# Patient Record
Sex: Female | Born: 1950 | Race: Asian | Hispanic: No | Marital: Single | Smoking: Never smoker
Health system: Southern US, Community
[De-identification: ages and names within clinical notes are randomized; demographics above are authoritative.]

## PROBLEM LIST (undated history)

## (undated) DIAGNOSIS — M549 Dorsalgia, unspecified: Secondary | ICD-10-CM

## (undated) DIAGNOSIS — K760 Fatty (change of) liver, not elsewhere classified: Secondary | ICD-10-CM

## (undated) DIAGNOSIS — E039 Hypothyroidism, unspecified: Secondary | ICD-10-CM

## (undated) HISTORY — DX: Hypothyroidism, unspecified: E03.9

## (undated) HISTORY — PX: CATARACT EXTRACTION: SUR2

## (undated) HISTORY — DX: Fatty (change of) liver, not elsewhere classified: K76.0

---

## 2021-10-09 ENCOUNTER — Other Ambulatory Visit: Payer: Self-pay

## 2021-10-09 ENCOUNTER — Ambulatory Visit
Admission: RE | Admit: 2021-10-09 | Discharge: 2021-10-09 | Disposition: A | Discharge status: Home / Self Care | Payer: 59 | Source: Ambulatory Visit | Attending: Physician Assistant | Admitting: Physician Assistant

## 2021-10-09 ENCOUNTER — Ambulatory Visit (INDEPENDENT_AMBULATORY_CARE_PROVIDER_SITE_OTHER): Payer: 59

## 2021-10-09 VITALS — BP 120/76 | HR 80 | Temp 99.1°F | Resp 18

## 2021-10-09 DIAGNOSIS — J020 Streptococcal pharyngitis: Secondary | ICD-10-CM

## 2021-10-09 DIAGNOSIS — M549 Dorsalgia, unspecified: Secondary | ICD-10-CM | POA: Diagnosis not present

## 2021-10-09 DIAGNOSIS — J069 Acute upper respiratory infection, unspecified: Secondary | ICD-10-CM

## 2021-10-09 DIAGNOSIS — G8929 Other chronic pain: Secondary | ICD-10-CM

## 2021-10-09 DIAGNOSIS — M545 Low back pain, unspecified: Secondary | ICD-10-CM | POA: Diagnosis not present

## 2021-10-09 DIAGNOSIS — R059 Cough, unspecified: Secondary | ICD-10-CM | POA: Diagnosis not present

## 2021-10-09 HISTORY — DX: Dorsalgia, unspecified: M54.9

## 2021-10-09 LAB — POCT RAPID STREP A (OFFICE): Rapid Strep A Screen: POSITIVE — AB

## 2021-10-09 MED ORDER — NAPROXEN 500 MG PO TABS: 500.0000 mg | ORAL_TABLET | Freq: Two times a day (BID) | ORAL | 0 refills | Status: DC

## 2021-10-09 MED ORDER — AMOXICILLIN 500 MG PO CAPS: 500.0000 mg | ORAL_CAPSULE | Freq: Three times a day (TID) | ORAL | 0 refills | Status: DC

## 2021-10-09 NOTE — ED Provider Notes (Signed)
[facility]    CSN: [id number] Arrival date & time: 10/09/21  1255      History   Chief Complaint Chief Complaint  Patient presents with   Appointment    1300   Back Pain   Cough    HPI Cindy Rivera is a 71 y.o. female.   Patient here today for evaluation of cough she has had for the last week.  She has not had any fever.  She denies any other symptoms.  She also reports she has had some back pain for the last year.  She initially points to her low back for pain but then notes that with bowel movement she will have pain from her shoulders to her feet.  She does have appointment scheduled for primary care provider in the upcoming months.  They are concerned because she does have history of fracture.  She denies any known injury.  The history is provided by the patient.   Past Medical History:  Diagnosis Date   Back pain     There are no problems to display for this patient.   History reviewed. No pertinent surgical history.  OB History   No obstetric history on file.      Home Medications    Prior to Admission medications   Medication Sig Start Date End Date Taking? Authorizing Provider  amoxicillin (AMOXIL) 500 MG capsule Take 1 capsule (500 mg total) by mouth 3 (three) times daily. 10/09/21  Yes Tomi Bamberger, PA-C  naproxen (NAPROSYN) 500 MG tablet Take 1 tablet (500 mg total) by mouth 2 (two) times daily. 10/09/21  Yes Tomi Bamberger, PA-C    Family History History reviewed. No pertinent family history.  Social History Social History   Tobacco Use   Smoking status: Never   Smokeless tobacco: Never  Substance Use Topics   Alcohol use: Never   Drug use: Never     Allergies   Berberine   Review of Systems Review of Systems  Constitutional:  Negative for chills and fever.  HENT:  Positive for sore throat. Negative for congestion, ear pain and sinus pressure.   Eyes:  Negative for discharge and redness.  Respiratory:  Positive  for cough. Negative for shortness of breath and wheezing.   Gastrointestinal:  Negative for abdominal pain, diarrhea, nausea and vomiting.  Musculoskeletal:  Positive for back pain.    Physical Exam Triage Vital Signs ED Triage Vitals  Enc Vitals Group     BP      Pulse      Resp      Temp      Temp src      SpO2      Weight      Height      Head Circumference      Peak Flow      Pain Score      Pain Loc      Pain Edu?      Excl. in GC?    No data found.  Updated Vital Signs BP 120/76 (BP Location: Left Arm)    Pulse 80    Temp 99.1 F (37.3 C) (Oral)    Resp 18    SpO2 94%      Physical Exam Vitals and nursing note reviewed.  Constitutional:      General: She is not in acute distress.    Appearance: Normal appearance. She is not ill-appearing.  HENT:     Head: Normocephalic and atraumatic.  Nose: No congestion or rhinorrhea.     Mouth/Throat:     Mouth: Mucous membranes are moist.     Pharynx: No oropharyngeal exudate or posterior oropharyngeal erythema.  Eyes:     Conjunctiva/sclera: Conjunctivae normal.  Cardiovascular:     Rate and Rhythm: Normal rate and regular rhythm.     Heart sounds: Normal heart sounds. No murmur heard. Pulmonary:     Effort: Pulmonary effort is normal. No respiratory distress.     Breath sounds: Normal breath sounds. No wheezing, rhonchi or rales.  Musculoskeletal:     Comments: No TTP to midline spine or lower back  Skin:    General: Skin is warm and dry.  Neurological:     Mental Status: She is alert.  Psychiatric:        Mood and Affect: Mood normal.        Thought Content: Thought content normal.     UC Treatments / Results  Labs (all labs ordered are listed, but only abnormal results are displayed) Labs Reviewed  POCT RAPID STREP A (OFFICE) - Abnormal; Notable for the following components:      Result Value   Rapid Strep A Screen Positive (*)    All other components within normal limits  COVID-19, FLU A+B NAA     EKG   Radiology DG Chest 2 View  Result Date: 10/09/2021 CLINICAL DATA:  Cough EXAM: CHEST - 2 VIEW COMPARISON:  None. FINDINGS: The heart size and mediastinal contours are within normal limits. Both lungs are clear. The visualized skeletal structures are unremarkable. IMPRESSION: No acute abnormality of the lungs. Electronically Signed   By: Jearld Lesch M.D.   On: 10/09/2021 13:50   DG Lumbar Spine Complete  Result Date: 10/09/2021 CLINICAL DATA:  Back pain EXAM: LUMBAR SPINE - COMPLETE 4+ VIEW COMPARISON:  None. FINDINGS: Last lumbar vertebra is transitional. This report assumes partial lumbarization of S1 vertebra. No recent fracture is seen. Degenerative changes are noted in the lower lumbar spine with marked facet hypertrophy from L4-S1 levels. There is disc space narrowing at L4-L5 and L5-S1 levels. There is approximately 9 mm anterolisthesis at L4-L5 level. There is possible 5 mm anterolisthesis at L5-S1 level. IMPRESSION: No recent fracture is seen. Lumbar spondylosis, particularly severe at L4-L5 and L5-S1 levels as described in the body of the report. Electronically Signed   By: Ernie Avena M.D.   On: 10/09/2021 13:52    Procedures Procedures (including critical care time)  Medications Ordered in UC Medications - No data to display  Initial Impression / Assessment and Plan / UC Course  I have reviewed the triage vital signs and the nursing notes.  Pertinent labs & imaging results that were available during my care of the patient were reviewed by me and considered in my medical decision making (see chart for details).    CXR WNL, Strep test positive.  L Spine Xray with significant degenerative changes. Naproxen prescribed and Recommend follow up with PCP as planned. Follow up here with any concerns in the meantime.   Final Clinical Impressions(s) / UC Diagnoses   Final diagnoses:  Acute upper respiratory infection  Chronic back pain, unspecified back location,  unspecified back pain laterality  Streptococcal sore throat   Discharge Instructions   None    ED Prescriptions     Medication Sig Dispense Auth. Provider   naproxen (NAPROSYN) 500 MG tablet Take 1 tablet (500 mg total) by mouth 2 (two) times daily. 60 tablet Erma Pinto  F, PA-C   amoxicillin (AMOXIL) 500 MG capsule Take 1 capsule (500 mg total) by mouth 3 (three) times daily. 21 capsule Tomi Bamberger, PA-C      PDMP not reviewed this encounter.   Tomi Bamberger, PA-C 10/09/21 1511

## 2021-10-09 NOTE — ED Triage Notes (Signed)
Pt here for cough x 1 week; pt also sts back pain chronic x 1 year worse with having BM x months; pt does have PCP appointment in April and also has hx of similar with possible fracture; denies obvious injury

## 2021-10-11 LAB — COVID-19, FLU A+B NAA
Influenza A, NAA: NOT DETECTED
Influenza B, NAA: NOT DETECTED
SARS-CoV-2, NAA: NOT DETECTED

## 2021-11-01 ENCOUNTER — Other Ambulatory Visit: Payer: Self-pay

## 2021-11-01 ENCOUNTER — Ambulatory Visit (INDEPENDENT_AMBULATORY_CARE_PROVIDER_SITE_OTHER): Payer: 59

## 2021-11-01 ENCOUNTER — Ambulatory Visit
Admission: RE | Admit: 2021-11-01 | Discharge: 2021-11-01 | Disposition: A | Discharge status: Home / Self Care | Payer: 59 | Source: Ambulatory Visit | Attending: Physician Assistant | Admitting: Physician Assistant

## 2021-11-01 DIAGNOSIS — J029 Acute pharyngitis, unspecified: Secondary | ICD-10-CM

## 2021-11-01 DIAGNOSIS — R059 Cough, unspecified: Secondary | ICD-10-CM

## 2021-11-01 DIAGNOSIS — R0789 Other chest pain: Secondary | ICD-10-CM | POA: Diagnosis not present

## 2021-11-01 DIAGNOSIS — R0989 Other specified symptoms and signs involving the circulatory and respiratory systems: Secondary | ICD-10-CM

## 2021-11-01 LAB — POCT RAPID STREP A (OFFICE): Rapid Strep A Screen: NEGATIVE

## 2021-11-01 MED ORDER — OMEPRAZOLE 20 MG PO CPDR: 20.0000 mg | DELAYED_RELEASE_CAPSULE | Freq: Every day | ORAL | 0 refills | Status: DC

## 2021-11-01 NOTE — Discharge Instructions (Signed)
?  Strep test negative. Follow up with PCP as scheduled.  ?

## 2021-11-01 NOTE — ED Provider Notes (Addendum)
?[facility] ? ? ? ?CSN: [id number] ?Arrival date & time: 11/01/21  1705 ? ? ?  ? ?History   ?Chief Complaint ?Chief Complaint  ?Patient presents with  ? Cough  ? ? ?HPI ?Cindy Rivera is a 71 y.o. female.  ? ?Patient here today for evaluation of continued sore throat, cough and chest pressure since being seen close to a month ago. They are concerned she may have been exposed to strep again and is not sure that symptoms completely cleared. They request EKG. She has not had any fever. She has appointment with primary care next month.  ? ?The history is provided by the patient.  ?Cough ?Associated symptoms: sore throat   ?Associated symptoms: no chest pain, no chills, no eye discharge, no fever and no shortness of breath   ? ?Past Medical History:  ?Diagnosis Date  ? Back pain   ? ? ?There are no problems to display for this patient. ? ? ?History reviewed. No pertinent surgical history. ? ?OB History   ?No obstetric history on file. ?  ? ? ? ?Home Medications   ? ?Prior to Admission medications   ?Medication Sig Start Date End Date Taking? Authorizing Provider  ?amoxicillin (AMOXIL) 500 MG capsule Take 1 capsule (500 mg total) by mouth 3 (three) times daily. 10/09/21   Tomi Bamberger, PA-C  ?naproxen (NAPROSYN) 500 MG tablet Take 1 tablet (500 mg total) by mouth 2 (two) times daily. 10/09/21   Tomi Bamberger, PA-C  ? ? ?Family History ?History reviewed. No pertinent family history. ? ?Social History ?Social History  ? ?Tobacco Use  ? Smoking status: Never  ? Smokeless tobacco: Never  ?Substance Use Topics  ? Alcohol use: Never  ? Drug use: Never  ? ? ? ?Allergies   ?Berberine ? ? ?Review of Systems ?Review of Systems  ?Constitutional:  Negative for chills and fever.  ?HENT:  Positive for sore throat. Negative for congestion.   ?Eyes:  Negative for discharge and redness.  ?Respiratory:  Positive for cough. Negative for shortness of breath.   ?Cardiovascular:  Negative for chest pain.  ?Gastrointestinal:   Negative for nausea and vomiting.  ? ? ?Physical Exam ?Triage Vital Signs ?ED Triage Vitals [11/01/21 1723]  ?Enc Vitals Group  ?   BP 139/76  ?   Pulse Rate 66  ?   Resp 18  ?   Temp (!) 97.4 ?F (36.3 ?C)  ?   Temp Source Oral  ?   SpO2 93 %  ?   Weight   ?   Height   ?   Head Circumference   ?   Peak Flow   ?   Pain Score 0  ?   Pain Loc   ?   Pain Edu?   ?   Excl. in GC?   ? ?No data found. ? ?Updated Vital Signs ?BP 139/76 (BP Location: Left Arm)   Pulse 66   Temp (!) 97.4 ?F (36.3 ?C) (Oral)   Resp 18   SpO2 93%  ? ? ?Physical Exam ?Vitals and nursing note reviewed.  ?Constitutional:   ?   General: She is not in acute distress. ?   Appearance: Normal appearance. She is not ill-appearing.  ?HENT:  ?   Head: Normocephalic and atraumatic.  ?   Nose: No congestion or rhinorrhea.  ?   Mouth/Throat:  ?   Mouth: Mucous membranes are moist.  ?   Pharynx: Posterior oropharyngeal erythema present. No  oropharyngeal exudate.  ?Eyes:  ?   Conjunctiva/sclera: Conjunctivae normal.  ?Cardiovascular:  ?   Rate and Rhythm: Normal rate and regular rhythm.  ?   Heart sounds: Normal heart sounds. No murmur heard. ?Pulmonary:  ?   Effort: Pulmonary effort is normal. No respiratory distress.  ?   Breath sounds: Normal breath sounds. No wheezing, rhonchi or rales.  ?Skin: ?   General: Skin is warm and dry.  ?Neurological:  ?   Mental Status: She is alert.  ?Psychiatric:     ?   Mood and Affect: Mood normal.     ?   Thought Content: Thought content normal.  ? ? ? ?UC Treatments / Results  ?Labs ?(all labs ordered are listed, but only abnormal results are displayed) ?Labs Reviewed  ?COVID-19, FLU A+B NAA  ?POCT RAPID STREP A (OFFICE)  ? ? ?EKG ? ? ?Radiology ?DG Chest 2 View ? ?Result Date: 11/01/2021 ?CLINICAL DATA:  Cough, chest pressure EXAM: CHEST - 2 VIEW COMPARISON:  10/09/2021 FINDINGS: Heart and mediastinal contours are within normal limits. No focal opacities or effusions. No acute bony abnormality. IMPRESSION: No active  cardiopulmonary disease. Electronically Signed   By: Charlett Nose M.D.   On: 11/01/2021 17:58   ? ?Procedures ?Procedures (including critical care time) ? ?Medications Ordered in UC ?Medications - No data to display ? ?Initial Impression / Assessment and Plan / UC Course  ?I have reviewed the triage vital signs and the nursing notes. ? ?Pertinent labs & imaging results that were available during my care of the patient were reviewed by me and considered in my medical decision making (see chart for details). ? ?  ?Strep test negative. CXR and EKG without concerning findings. Recommended follow up with PCP as scheduled. Will order covid and flu screening. Encouraged follow up sooner with any concerns.  ? ?After leaving the room they request prescription for possible acid reflux. Omeprazole rx sent to pharmacy.  ? ?Final Clinical Impressions(s) / UC Diagnoses  ? ?Final diagnoses:  ?Sore throat  ?Chest pressure  ? ?Discharge Instructions   ?None ?  ? ?ED Prescriptions   ?None ?  ? ?PDMP not reviewed this encounter. ?  ?Tomi Bamberger, PA-C ?11/01/21 1839 ? ?  ?Tomi Bamberger, PA-C ?11/01/21 1853 ? ?

## 2021-11-01 NOTE — ED Triage Notes (Signed)
Pt c/o cough and chest pressure. States tested strep(+) a few weeks ago. Was tx here. Family states pt was caring for kids recently that also tested strep(+). Family states pt sxs got better during her tx but not completely resolved and are concerned about the chest pressure she is still experiencing.  ?

## 2021-11-03 LAB — COVID-19, FLU A+B NAA
Influenza A, NAA: NOT DETECTED
Influenza B, NAA: NOT DETECTED
SARS-CoV-2, NAA: NOT DETECTED

## 2021-12-03 ENCOUNTER — Encounter: Payer: Self-pay | Admitting: Internal Medicine

## 2021-12-03 ENCOUNTER — Ambulatory Visit: Payer: 59 | Attending: Internal Medicine | Admitting: Internal Medicine

## 2021-12-03 VITALS — BP 116/74 | HR 71 | Resp 16 | Ht 61.5 in | Wt 145.6 lb

## 2021-12-03 DIAGNOSIS — J312 Chronic pharyngitis: Secondary | ICD-10-CM

## 2021-12-03 DIAGNOSIS — Z7689 Persons encountering health services in other specified circumstances: Secondary | ICD-10-CM | POA: Diagnosis not present

## 2021-12-03 DIAGNOSIS — R053 Chronic cough: Secondary | ICD-10-CM | POA: Diagnosis not present

## 2021-12-03 DIAGNOSIS — M5416 Radiculopathy, lumbar region: Secondary | ICD-10-CM | POA: Diagnosis not present

## 2021-12-03 DIAGNOSIS — I2510 Atherosclerotic heart disease of native coronary artery without angina pectoris: Secondary | ICD-10-CM

## 2021-12-03 DIAGNOSIS — R7989 Other specified abnormal findings of blood chemistry: Secondary | ICD-10-CM

## 2021-12-03 DIAGNOSIS — K219 Gastro-esophageal reflux disease without esophagitis: Secondary | ICD-10-CM

## 2021-12-03 MED ORDER — LIDOCAINE 5 % EX PTCH: 1.0000 | MEDICATED_PATCH | CUTANEOUS | 1 refills | Status: DC

## 2021-12-03 MED ORDER — OMEPRAZOLE 20 MG PO CPDR: 20.0000 mg | DELAYED_RELEASE_CAPSULE | Freq: Two times a day (BID) | ORAL | 4 refills | Status: DC

## 2021-12-03 MED ORDER — FLUTICASONE PROPIONATE 50 MCG/ACT NA SUSP: NASAL | 1 refills | Status: DC

## 2021-12-03 MED ORDER — LORATADINE 10 MG PO TABS: 10.0000 mg | ORAL_TABLET | Freq: Every day | ORAL | 1 refills | Status: DC

## 2021-12-03 NOTE — Progress Notes (Signed)
? ? ?Patient ID: Cindy LatinaXiulan Duby, female    DOB: [birth year]  MRN: [id number] ? ?CC: Hospitalization Follow-up (Urgent care f/u/Cough/Sore throat /Chest pain/Pain scale 3 out of 10 ), Back Pain (Pain scale 7/8 out of 10), and New Patient (Initial Visit) ? ? ?Subjective: ?Cindy Rivera is a 71 y.o. female who presents to est care.  Daughter, Chartered certified accountantCarina Daughtry, is with her and interprets.  Pt speaks [place] ?Her concerns today include:  ? ?No previous PCP in the area. 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  ? ?Patient has several medications with her from [place] that includes atorvastatin, aspirin, and allergy pill, and antibiotic ? ?Complains of chronic bilateral lower back pain x20 years.  [place] with disc problem [place] country.  Pain was mild until 1 year ago.  She has been using a back support intermittently that her daughter purchased for her off the Internet. ?-Pain is across the lower back and in tailbone.  Radiates down both legs and associated with numbness and tingling.  No numbness in the groin area.  No incontinence of bowel or bladder.  Symptoms are most noticeable when she sits on the commode to have a bowel movement, with lifting and increased physical activity like when she is cleaning up the house.  Rates pain as 8-9/10 with these activities.  Otherwise it stays at a level of 6-7/10.  Given Naprosyn to use as needed through UC recently.  Taking it once a day because it makes her acid reflux worse.  She is on omeprazole. ?-X-ray of lumbar spine done through urgent care 10/09/2021 revealed lumbar spondylosis particularly severe at L4-S1 with some anterolisthesis also at these levels. ? ?Complains of chronic cough and sore throat x20 years. ?-Diagnosed with strep throat through urgent care 10/09/2021 when she presented with cough and sore throat.  Daughter reports that her youngest son got strep throat and RSV the  same week.   ?-Chest x-ray without acute findings.  Treated with amoxicillin 3 times daily for 7 days which she completed. ?-Return to urgent care 11/01/2021 with complaint of continued sore throat, cough and chest pressure.  Repeat strep test was negative.  Chest x-ray and EKG without concerning findings.  They recommended follow-up with PCP. ?-Patient tells me that her cough got worse after having strep throat.  Cough is productive of white mucus and worse at night and in the mornings.  Endorses GERD symptoms at nights but states it is a little better since being on omeprazole.  Endorses itchy throat and sneezing.  Not sure of symptoms of postnasal drip.  She is not taking her allergy pill. ?-Denies dysphagia or voice changes.  No major weight changes. ? ?Gives hx of CAD. Had imaging of heart in [place] (not sure if cath vs coronary CT) and told she had mild blockage in 2021.  Placed on ASA and Lipitor but has not been taking consistently. Reports intermittent CP with exertion.  She uses a pill which she was given by her doctor in [place] that sounds like SL Nitro  ? 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  TSH was found to be 8.29.  No known history of hypothyroidism. ? ?Current Outpatient Medications on File Prior to Visit  ?Medication Sig Dispense Refill  ? ASPIRIN 81 PO Take by mouth.    ? atorvastatin (  LIPITOR) 20 MG tablet Take 20 mg by mouth daily.    ? ?No current facility-administered medications on file prior to visit.  ? ? ?Allergies  ?Allergen Reactions  ? Berberine   ? ? ?Social History  ? ?Socioeconomic History  ? Marital status: Single  ?  Spouse name: Not on file  ? Number of children: Not on file  ? Years of education: Not on file  ? Highest education level: Not on file  ?Occupational History  ? Not on file  ?Tobacco Use  ? Smoking status: Never  ? Smokeless tobacco: Never  ?Substance and Sexual Activity  ? Alcohol use: Never  ?  Drug use: Never  ? Sexual activity: Not on file  ?Other Topics Concern  ? Not on file  ?Social History Narrative  ? Not on file  ? ?Social Determinants of Health  ? ?Financial Resource Strain: Not on file  ?Food Insecurity: Not on file  ?Transportation Needs: Not on file  ?Physical Activity: Not on file  ?Stress: Not on file  ?Social Connections: Not on file  ?Intimate Partner Violence: Not on file  ? ? ?Family History  ?Family history unknown: Yes  ? ? ?Past Surgical History:  ?Procedure Laterality Date  ? CATARACT EXTRACTION    ? ? ?ROS: ?Review of Systems ?Negative except as stated above ? ?PHYSICAL EXAM: ?BP 116/74   Pulse 71   Resp 16   Ht 5' 1.5" (1.562 m)   Wt 145 lb 9.6 oz (66 kg)   SpO2 95%   BMI 27.07 kg/m?   ?Wt Readings from Last 3 Encounters:  ?12/03/21 145 lb 9.6 oz (66 kg)  ? ? ?Physical Exam ? ?General appearance - alert, well appearing, and in no distress.  Patient is a very difficult historian ?Mental status - normal mood, behavior, speech, dress, motor activity, and thought processes ?Eyes - pupils equal and reactive, extraocular eye movements intact ?Nose - normal and patent, no erythema, discharge or polyps ?Mouth - mucous membranes moist, pharynx normal without lesions ?Neck - supple, no significant adenopathy ?Lymphatics -no cervical axillary lymphadenopathy. ?Chest - clear to auscultation, no wheezes, rales or rhonchi, symmetric air entry ?Heart - normal rate, regular rhythm, normal S1, S2, no murmurs, rubs, clicks or gallops ?Neurological -straight leg raise negative.  Power in both lower extremities 5/5 bilaterally.  On gross sensation, she reports feeling that the left lower leg is cold compared to the right.  Both legs feel warm on exam.  No cyanosis noted.  No lower extremity edema. ?Patient noted to get up off exam table and stand up without apparent difficulty.  Not ambulating with any assistive device. ? ? ?  Latest Ref Rng & Units 12/03/2021  ? 11:05 AM  ?CMP  ?Glucose 70 - 99  mg/dL 98    ?BUN 8 - 27 mg/dL 13    ?Creatinine 0.57 - 1.00 mg/dL 5.09    ?Sodium 134 - 144 mmol/L 144    ?Potassium 3.5 - 5.2 mmol/L 5.0    ?Chloride 96 - 106 mmol/L 104    ?CO2 20 - 29 mmol/L 27    ?Calcium 8.7 - 10.3 mg/dL 9.6    ?Total Protein 6.0 - 8.5 g/dL 7.7    ?Total Bilirubin 0.0 - 1.2 mg/dL 0.7    ?Alkaline Phos 44 - 121 IU/L 65    ?AST 0 - 40 IU/L 19    ?ALT 0 - 32 IU/L 15    ? ?Lipid Panel  ?   ?  Component Value Date/Time  ? CHOL 308 (H) 12/03/2021 1105  ? TRIG 87 12/03/2021 1105  ? HDL 60 12/03/2021 1105  ? CHOLHDL 5.1 (H) 12/03/2021 1105  ? LDLCALC 234 (H) 12/03/2021 1105  ? ? ?CBC ?   ?Component Value Date/Time  ? WBC 4.9 12/03/2021 1105  ? RBC 4.52 12/03/2021 1105  ? HGB 13.5 12/03/2021 1105  ? HCT 40.8 12/03/2021 1105  ? PLT 226 12/03/2021 1105  ? MCV 90 12/03/2021 1105  ? MCH 29.9 12/03/2021 1105  ? MCHC 33.1 12/03/2021 1105  ? RDW 12.4 12/03/2021 1105  ? ? ?ASSESSMENT AND PLAN: ?1. Establishing care with new doctor, encounter for ? ? ?2. Bilateral lumbar radiculopathy ?Symptoms suggestive of sciatica either due to disc ds vs spinal stenosis. ?Patient reports symptoms have worsened over the past 1 year with more persistent pain.  I will order an MRI of the back.  Advised to avoid heavy lifting or strenuous activities that aggravates her back. ?Advised to stop Naprosyn given that this makes her GERD symptoms worse.  We will try her with a lidocaine patch. ?- lidocaine (LIDODERM) 5 %; Place 1 patch onto the skin daily. Remove & Discard patch within 12 hours or as directed by MD  Dispense: 30 patch; Refill: 1 ?- MR Lumbar Spine Wo Contrast; Future ? ?3. Chronic cough ?Differential diagnoses include chronic cough/sore throat due to GERD and allergies/postnasal drip. ?Recommend starting Claritin and Flonase nasal spray.  Advised that the nasal spray can cause nosebleeds if used on a daily basis. ?GERD precautions discussed.  Advised to avoid certain foods like spicy foods, tomato-based foods, juices and  excessive caffeine.  Advised to eat his last meal at least 2 to 3 hours before laying down at nights and to sleep with his head slightly elevated.  Omeprazole increased to twice daily dosing. ? ?- loratadine

## 2021-12-03 NOTE — Patient Instructions (Signed)
Gastroesophageal Reflux Disease, Adult  Gastroesophageal reflux (GER) happens when acid from the stomach flows up into the tube that connects the mouth and the stomach (esophagus). Normally, food travels down the esophagus and stays in the stomach to be digested. With GER, food and stomach acid sometimes move back up into the esophagus. You may have a disease called gastroesophageal reflux disease (GERD) if the reflux: Happens often. Causes frequent or very bad symptoms. Causes problems such as damage to the esophagus. When this happens, the esophagus becomes sore and swollen. Over time, GERD can make small holes (ulcers) in the lining of the esophagus. What are the causes? This condition is caused by a problem with the muscle between the esophagus and the stomach. When this muscle is weak or not normal, it does not close properly to keep food and acid from coming back up from the stomach. The muscle can be weak because of: Tobacco use. Pregnancy. Having a certain type of hernia (hiatal hernia). Alcohol use. Certain foods and drinks, such as coffee, chocolate, onions, and peppermint. What increases the risk? Being overweight. Having a disease that affects your connective tissue. Taking NSAIDs, such a ibuprofen. What are the signs or symptoms? Heartburn. Difficult or painful swallowing. The feeling of having a lump in the throat. A bitter taste in the mouth. Bad breath. Having a lot of saliva. Having an upset or bloated stomach. Burping. Chest pain. Different conditions can cause chest pain. Make sure you see your doctor if you have chest pain. Shortness of breath or wheezing. A long-term cough or a cough at night. Wearing away of the surface of teeth (tooth enamel). Weight loss. How is this treated? Making changes to your diet. Taking medicine. Having surgery. Treatment will depend on how bad your symptoms are. Follow these instructions at home: Eating and drinking  Follow a  diet as told by your doctor. You may need to avoid foods and drinks such as: Coffee and tea, with or without caffeine. Drinks that contain alcohol. Energy drinks and sports drinks. Bubbly (carbonated) drinks or sodas. Chocolate and cocoa. Peppermint and mint flavorings. Garlic and onions. Horseradish. Spicy and acidic foods. These include peppers, chili powder, curry powder, vinegar, hot sauces, and BBQ sauce. Citrus fruit juices and citrus fruits, such as oranges, lemons, and limes. Tomato-based foods. These include red sauce, chili, salsa, and pizza with red sauce. Fried and fatty foods. These include donuts, french fries, potato chips, and high-fat dressings. High-fat meats. These include hot dogs, rib eye steak, sausage, ham, and bacon. High-fat dairy items, such as whole milk, butter, and cream cheese. Eat small meals often. Avoid eating large meals. Avoid drinking large amounts of liquid with your meals. Avoid eating meals during the 2-3 hours before bedtime. Avoid lying down right after you eat. Do not exercise right after you eat. Lifestyle  Do not smoke or use any products that contain nicotine or tobacco. If you need help quitting, ask your doctor. Try to lower your stress. If you need help doing this, ask your doctor. If you are overweight, lose an amount of weight that is healthy for you. Ask your doctor about a safe weight loss goal. General instructions Pay attention to any changes in your symptoms. Take over-the-counter and prescription medicines only as told by your doctor. Do not take aspirin, ibuprofen, or other NSAIDs unless your doctor says it is okay. Wear loose clothes. Do not wear anything tight around your waist. Raise (elevate) the head of your bed about   6 inches (15 cm). You may need to use a wedge to do this. Avoid bending over if this makes your symptoms worse. Keep all follow-up visits. Contact a doctor if: You have new symptoms. You lose weight and you  do not know why. You have trouble swallowing or it hurts to swallow. You have wheezing or a cough that keeps happening. You have a hoarse voice. Your symptoms do not get better with treatment. Get help right away if: You have sudden pain in your arms, neck, jaw, teeth, or back. You suddenly feel sweaty, dizzy, or light-headed. You have chest pain or shortness of breath. You vomit and the vomit is green, yellow, or black, or it looks like blood or coffee grounds. You faint. Your poop (stool) is red, bloody, or black. You cannot swallow, drink, or eat. These symptoms may represent a serious problem that is an emergency. Do not wait to see if the symptoms will go away. Get medical help right away. Call your local emergency services (911 in the [place] Do not drive yourself to the hospital. Summary If a person has gastroesophageal reflux disease (GERD), food and stomach acid move back up into the esophagus and cause symptoms or problems such as damage to the esophagus. Treatment will depend on how bad your symptoms are. Follow a diet as told by your doctor. Take all medicines only as told by your doctor. This information is not intended to replace advice given to you by your health care provider. Make sure you discuss any questions you have with your health care provider. Document Revised: 02/17/2020 Document Reviewed: 02/17/2020 Elsevier Patient Education  2023 Elsevier Inc.  

## 2021-12-04 ENCOUNTER — Other Ambulatory Visit: Payer: Self-pay | Admitting: Internal Medicine

## 2021-12-04 DIAGNOSIS — J312 Chronic pharyngitis: Secondary | ICD-10-CM | POA: Insufficient documentation

## 2021-12-04 DIAGNOSIS — R7989 Other specified abnormal findings of blood chemistry: Secondary | ICD-10-CM | POA: Insufficient documentation

## 2021-12-04 DIAGNOSIS — K219 Gastro-esophageal reflux disease without esophagitis: Secondary | ICD-10-CM | POA: Insufficient documentation

## 2021-12-04 DIAGNOSIS — R053 Chronic cough: Secondary | ICD-10-CM | POA: Insufficient documentation

## 2021-12-04 DIAGNOSIS — M5416 Radiculopathy, lumbar region: Secondary | ICD-10-CM | POA: Insufficient documentation

## 2021-12-04 DIAGNOSIS — I2510 Atherosclerotic heart disease of native coronary artery without angina pectoris: Secondary | ICD-10-CM | POA: Insufficient documentation

## 2021-12-04 LAB — COMPREHENSIVE METABOLIC PANEL
ALT: 15 IU/L (ref 0–32)
AST: 19 IU/L (ref 0–40)
Albumin/Globulin Ratio: 1.7 (ref 1.2–2.2)
Albumin: 4.8 g/dL (ref 3.8–4.8)
Alkaline Phosphatase: 65 IU/L (ref 44–121)
BUN/Creatinine Ratio: 15 (ref 12–28)
BUN: 13 mg/dL (ref 8–27)
Bilirubin Total: 0.7 mg/dL (ref 0.0–1.2)
CO2: 27 mmol/L (ref 20–29)
Calcium: 9.6 mg/dL (ref 8.7–10.3)
Chloride: 104 mmol/L (ref 96–106)
Creatinine, Ser: 0.87 mg/dL (ref 0.57–1.00)
Globulin, Total: 2.9 g/dL (ref 1.5–4.5)
Glucose: 98 mg/dL (ref 70–99)
Potassium: 5 mmol/L (ref 3.5–5.2)
Sodium: 144 mmol/L (ref 134–144)
Total Protein: 7.7 g/dL (ref 6.0–8.5)
eGFR: 72 mL/min/{1.73_m2} (ref >59)

## 2021-12-04 LAB — CBC
Hematocrit: 40.8 % (ref 34.0–46.6)
Hemoglobin: 13.5 g/dL (ref 11.1–15.9)
MCH: 29.9 pg (ref 26.6–33.0)
MCHC: 33.1 g/dL (ref 31.5–35.7)
MCV: 90 fL (ref 79–97)
Platelets: 226 10*3/uL (ref 150–450)
RBC: 4.52 x10E6/uL (ref 3.77–5.28)
RDW: 12.4 % (ref 11.7–15.4)
WBC: 4.9 10*3/uL (ref 3.4–10.8)

## 2021-12-04 LAB — LIPID PANEL
Chol/HDL Ratio: 5.1 ratio — ABNORMAL HIGH (ref 0.0–4.4)
Cholesterol, Total: 308 mg/dL — ABNORMAL HIGH (ref 100–199)
HDL: 60 mg/dL (ref >39)
LDL Chol Calc (NIH): 234 mg/dL — ABNORMAL HIGH (ref 0–99)
Triglycerides: 87 mg/dL (ref 0–149)
VLDL Cholesterol Cal: 14 mg/dL (ref 5–40)

## 2021-12-04 LAB — TSH+T4F+T3FREE
Free T4: 0.71 ng/dL — ABNORMAL LOW (ref 0.82–1.77)
T3, Free: 2.4 pg/mL (ref 2.0–4.4)
TSH: 6.4 u[IU]/mL — ABNORMAL HIGH (ref 0.450–4.500)

## 2021-12-04 MED ORDER — LEVOTHYROXINE SODIUM 25 MCG PO TABS: 25.0000 ug | ORAL_TABLET | Freq: Every day | ORAL | 3 refills | Status: DC

## 2021-12-05 ENCOUNTER — Other Ambulatory Visit: Payer: Self-pay | Admitting: Internal Medicine

## 2021-12-05 DIAGNOSIS — K219 Gastro-esophageal reflux disease without esophagitis: Secondary | ICD-10-CM

## 2021-12-06 NOTE — Telephone Encounter (Signed)
Requested medication (s) are due for refill today: see note for dose adjustment per pharmacy ? ?Requested medication (s) are on the active medication list: yes ? ?Last refill:  12/03/21 #60 4 refills ? ?Future visit scheduled: yes in 1 month ? ?Notes to clinic:  Pharmacy comment: Insurance  only pays for 1 cap per day. Not 2 per day.  Can we get  Rx for 40mg ?  thanks ? ? ?  ?Requested Prescriptions  ?Pending Prescriptions Disp Refills  ? omeprazole (PRILOSEC) 20 MG capsule [Pharmacy Med Name: OMEPRAZOLE 20MG  CAP] 60 capsule 4  ?  Sig: TAKE 1 CAPSULE BY MOUTH TWICE DAILY BEFORE A MEAL  ?  ? Gastroenterology: Proton Pump Inhibitors Passed - 12/05/2021  4:41 PM  ?  ?  Passed - Valid encounter within last 12 months  ?  Recent Outpatient Visits   ? ?      ? 3 days ago Establishing care with new doctor, encounter for  ? [facility] 12/07/2021, MD  ? ?  ?  ?Future Appointments   ? ?        ? In 1 month [facility], MD [facility]  ? ?  ? ? ?  ?  ?  ? ?

## 2021-12-07 MED ORDER — OMEPRAZOLE 40 MG PO CPDR: 40.0000 mg | DELAYED_RELEASE_CAPSULE | Freq: Every day | ORAL | 1 refills | Status: DC

## 2021-12-08 ENCOUNTER — Encounter: Payer: Self-pay | Admitting: Internal Medicine

## 2021-12-10 ENCOUNTER — Other Ambulatory Visit: Payer: Self-pay | Admitting: Family Medicine

## 2021-12-10 MED ORDER — ATORVASTATIN CALCIUM 20 MG PO TABS: 20.0000 mg | ORAL_TABLET | Freq: Every day | ORAL | 1 refills | Status: DC

## 2021-12-20 ENCOUNTER — Ambulatory Visit: Payer: 59 | Admitting: Internal Medicine

## 2021-12-20 ENCOUNTER — Encounter: Payer: Self-pay | Admitting: Internal Medicine

## 2021-12-20 VITALS — BP 112/64 | HR 61 | Ht <= 58 in | Wt 147.8 lb

## 2021-12-20 DIAGNOSIS — R079 Chest pain, unspecified: Secondary | ICD-10-CM | POA: Diagnosis not present

## 2021-12-20 DIAGNOSIS — R072 Precordial pain: Secondary | ICD-10-CM

## 2021-12-20 MED ORDER — METOPROLOL TARTRATE 50 MG PO TABS: 50.0000 mg | ORAL_TABLET | Freq: Once | ORAL | 0 refills | Status: DC

## 2021-12-20 NOTE — Progress Notes (Signed)
?Cardiology Office Note:   ? ?Date:  12/20/2021  ? ?ID:  [name], DOB May 30, 1951, MRN [id number] ? ?PCP:  Pcp, No ?  ?[facility] Providers ?Cardiologist:  Maisie Fus, MD    ? ?Referring MD: Marcine Matar, MD  ? ?No chief complaint on file. ?Chest pain ? ?History of Present Illness:   ? ?Cindy Rivera is a 71 y.o. female with a hx of back pain, latent TB,  hypothyroidism , referral for ?atherosclerosis ? ?[facility] referred to cardiology for atherosclerosis . She had a work up in [place] and told he had atherosclerosis.  She noted having an [place] diagnosed with atherosclerosis. He was started on aspirin and lipitor. He reported intermittent chest discomfort with exertion. When she walks she feels chest pressure and SOB.  Non smoker.  No orthopnea or PND. No LE edema. No DM2. Normal blood pressure ? ?Past Medical History:  ?Diagnosis Date  ? Back pain   ? ? ?Past Surgical History:  ?Procedure Laterality Date  ? CATARACT EXTRACTION    ? ? ?Current Medications: ?Current Meds  ?Medication Sig  ? atorvastatin (LIPITOR) 20 MG tablet Take 1 tablet (20 mg total) by mouth daily.  ? levothyroxine (SYNTHROID) 25 MCG tablet Take 1 tablet (25 mcg total) by mouth daily.  ? lidocaine (LIDODERM) 5 % Place 1 patch onto the skin daily. Remove & Discard patch within 12 hours or as directed by MD  ? loratadine (CLARITIN) 10 MG tablet Take 1 tablet (10 mg total) by mouth daily.  ? metoprolol tartrate (LOPRESSOR) 50 MG tablet Take 1 tablet (50 mg total) by mouth once for 1 dose. PLEASE TAKE METOPROLOL 2  HOURS PRIOR TO CTA SCAN.  ? omeprazole (PRILOSEC) 40 MG capsule Take 1 capsule (40 mg total) by mouth daily.  ?  ? ?Allergies:   Berberine  ? ?Social History  ? ?Socioeconomic History  ? Marital status: Single  ?  Spouse name: Not on file  ? Number of children: Not on file  ? Years of education: Not on file  ? Highest education level: Not on file  ?Occupational History  ? Not on  file  ?Tobacco Use  ? Smoking status: Never  ? Smokeless tobacco: Never  ?Substance and Sexual Activity  ? Alcohol use: Never  ? Drug use: Never  ? Sexual activity: Not on file  ?Other Topics Concern  ? Not on file  ?Social History Narrative  ? Not on file  ? ?Social Determinants of Health  ? ?Financial Resource Strain: Not on file  ?Food Insecurity: Not on file  ?Transportation Needs: Not on file  ?Physical Activity: Not on file  ?Stress: Not on file  ?Social Connections: Not on file  ?  ? ?Family History: ?The patient's unknown, no consistent follow up in [place] ? ?ROS:   ?Please see the history of present illness.    ? ? All other systems reviewed and are negative. ? ?EKGs/Labs/Other Studies Reviewed:   ? ?The following studies were reviewed today: ? ? ?EKG:  EKG is  ordered today.  The ekg ordered today demonstrates  ? ?12/20/2021-NSR ? ?Recent Labs: ?12/03/2021: ALT 15; BUN 13; Creatinine, Ser 0.87; Hemoglobin 13.5; Platelets 226; Potassium 5.0; Sodium 144; TSH 6.400  ?Recent Lipid Panel ?   ?Component Value Date/Time  ? CHOL 308 (H) 12/03/2021 1105  ? TRIG 87 12/03/2021 1105  ? HDL 60 12/03/2021 1105  ? CHOLHDL 5.1 (H)  12/03/2021 1105  ? LDLCALC 234 (H) 12/03/2021 1105  ? ? ? ?Risk Assessment/Calculations:   ?  ? ?    ? ?Physical Exam:   ? ?VS:   ? ?Vitals:  ? 12/20/21 1453  ?BP: 112/64  ?Pulse: 61  ?SpO2: 98%  ? ? ? ?Wt Readings from Last 3 Encounters:  ?12/20/21 147 lb 12.8 oz (67 kg)  ?12/03/21 145 lb 9.6 oz (66 kg)  ?  ? ?GEN:  Well nourished, well developed in no acute distress ?HEENT: Normal ?NECK: No JVD; No carotid bruits ?LYMPHATICS: No lymphadenopathy ?CARDIAC: RRR, no murmurs, rubs, gallops ?RESPIRATORY:  Clear to auscultation without rales, wheezing or rhonchi  ?ABDOMEN: Soft, non-tender, non-distended ?MUSCULOSKELETAL:  No edema; No deformity  ?SKIN: Warm and dry ?NEUROLOGIC:  Alert and oriented x 3 ?PSYCHIATRIC:  Normal affect  ? ?ASSESSMENT:   ? ?Chest pain: notes chest discomfort with  exertion c/f angina. LDL is very high. Noted to have possible atherosclerosis in [place]. Will plan for coronary CTA and an echo. ? ?HLD: profound. LDL 243 mg/dL. Just started atorvastatin. Will repeat in lipids in 6 weeks and likely need to uptritrate. Other options include repatha ? ? ?PLAN:   ? ?In order of problems listed above: ? ?Coronary CTA ?Echo ?Fasting lipids in 6 weeks ?Follow up in 3 months ? ?   ? ?   ?Medication Adjustments/Labs and Tests Ordered: ?Current medicines are reviewed at length with the patient today.  Concerns regarding medicines are outlined above.  ?Orders Placed This Encounter  ?Procedures  ? CT CORONARY MORPH W/CTA COR W/SCORE W/CA W/CM &/OR WO/CM  ? Lipid panel  ? EKG 12-Lead  ? ECHOCARDIOGRAM COMPLETE  ? ?Meds ordered this encounter  ?Medications  ? metoprolol tartrate (LOPRESSOR) 50 MG tablet  ?  Sig: Take 1 tablet (50 mg total) by mouth once for 1 dose. PLEASE TAKE METOPROLOL 2  HOURS PRIOR TO CTA SCAN.  ?  Dispense:  1 tablet  ?  Refill:  0  ? ? ?Patient Instructions  ?Medication Instructions:  ?PLEASE TAKE METOPROLOL 50mg  TWO HOURS PRIOR TO CTA SCAN  ?*If you need a refill on your cardiac medications before your next appointment, please call your pharmacy* ? ?Lab Work: ? ?Please return for Blood Work in 6 WEEKS. No appointment needed, lab here at the office is open Monday-Friday from 8AM to 4PM and closed daily for lunch from 12:45-1:45.  ? ?If you have labs (blood work) drawn today and your tests are completely normal, you will receive your results only by: ?MyChart Message (if you have MyChart) OR ?A paper copy in the mail ?If you have any lab test that is abnormal or we need to change your treatment, we will call you to review the results. ? ?Testing/Procedures: ?Your physician has requested that you have cardiac CT. Cardiac computed tomography (CT) is a painless test that uses an x-ray machine to take clear, detailed pictures of your heart. For further information please visit  [email or web address]. Please follow instruction sheet as given. ? ?Your physician has requested that you have an echocardiogram. Echocardiography is a painless test that uses sound waves to create images of your heart. It provides your doctor with information about the size and shape of your heart and how well your heart?s chambers and valves are working. You may receive an ultrasound enhancing agent through an IV if needed to better visualize your heart during the echo.This procedure takes approximately one hour. There are no restrictions for this  procedure. This will take place at the [address].  ? ?Follow-Up: ?At [facility], you and your health needs are our priority.  As part of our continuing mission to provide you with exceptional heart care, we have created designated Provider Care Teams.  These Care Teams include your primary Cardiologist (physician) and Advanced Practice Providers (APPs -  Physician Assistants and Nurse Practitioners) who all work together to provide you with the care you need, when you need it. ? ?Your next appointment:   ?3 month(s) ? ?The format for your next appointment:   ?In Person ? ?Provider:   ?Maisie Fus, MD   ? ?Other Instructions ? ? ?Your cardiac CT will be scheduled at one of the below locations:  ? ?[facility] ?[address] ?[place], [place] [address] ?[phone] ? ?If scheduled at [facility], please arrive at the [facility] and Children's Entrance (Entrance C2) of [facility] 30 minutes prior to test start time. ?You can use the FREE valet parking offered at entrance C (encouraged to control the heart rate for the test)  ?Proceed to the [facility] Radiology Department (first floor) to check-in and test prep. ? ?All radiology patients and guests should use entrance C2 at [facility], accessed from [facility], even though the hospital's physical address listed is [address] ? ? ? ?Please follow these instructions carefully (unless otherwise directed): ? ?On the Night Before the Test: ?Be sure to Drink plenty of water. ?Do not consume any caffeinated/decaffeinated beverages or chocolate 12 h

## 2021-12-20 NOTE — Patient Instructions (Signed)
Medication Instructions:  ?PLEASE TAKE METOPROLOL 50mg  TWO HOURS PRIOR TO CTA SCAN  ?*If you need a refill on your cardiac medications before your next appointment, please call your pharmacy* ? ?Lab Work: ? ?Please return for Blood Work in 6 WEEKS. No appointment needed, lab here at the office is open Monday-Friday from 8AM to 4PM and closed daily for lunch from 12:45-1:45.  ? ?If you have labs (blood work) drawn today and your tests are completely normal, you will receive your results only by: ?MyChart Message (if you have MyChart) OR ?A paper copy in the mail ?If you have any lab test that is abnormal or we need to change your treatment, we will call you to review the results. ? ?Testing/Procedures: ?Your physician has requested that you have cardiac CT. Cardiac computed tomography (CT) is a painless test that uses an x-ray machine to take clear, detailed pictures of your heart. For further information please visit . Please follow instruction sheet as given. ? ?Your physician has requested that you have an echocardiogram. Echocardiography is a painless test that uses sound waves to create images of your heart. It provides your doctor with information about the size and shape of your heart and how well your heart?s chambers and valves are working. You may receive an ultrasound enhancing agent through an IV if needed to better visualize your heart during the echo.This procedure takes approximately one hour. There are no restrictions for this procedure. This will take place at the [address].  ? ?Follow-Up: ?At [facility], you and your health needs are our priority.  As part of our continuing mission to provide you with exceptional heart care, we have created designated Provider Care Teams.  These Care Teams include your primary Cardiologist (physician) and Advanced Practice Providers (APPs -  Physician Assistants and Nurse Practitioners) who all work together to provide you  with the care you need, when you need it. ? ?Your next appointment:   ?3 month(s) ? ?The format for your next appointment:   ?In Person ? ?Provider:   ?[facility], MD   ? ?Other Instructions ? ? ?Your cardiac CT will be scheduled at one of the below locations:  ? ?[facility] ?[address] ?[place], [place] ?[phone] ? ?If scheduled at [facility], please arrive at the [facility] and Children's Entrance (Entrance C2) of [facility] 30 minutes prior to test start time. ?You can use the FREE valet parking offered at entrance C (encouraged to control the heart rate for the test)  ?Proceed to the [facility] Radiology Department (first floor) to check-in and test prep. ? ?All radiology patients and guests should use entrance C2 at [facility], accessed from [facility], even though the hospital's physical address listed is [address] ? ? ? ?Please follow these instructions carefully (unless otherwise directed): ? ?On the Night Before the Test: ?Be sure to Drink plenty of water. ?Do not consume any caffeinated/decaffeinated beverages or chocolate 12 hours prior to your test. ?Do not take any antihistamines 12 hours prior to your test. ? ?On the Day of the Test: ?Drink plenty of water until 1 hour prior to the test. ?Do not eat any food 4 hours prior to the test. ?You may take your regular medications prior to the test.  ?Take metoprolol (Lopressor) two hours prior to test. ?HOLD Furosemide/Hydrochlorothiazide morning of the test. ?FEMALES- please wear underwire-free bra if available,  avoid dresses & tight clothing ?     ?After the Test: ?Drink plenty of water. ?After receiving IV contrast, you may experience a mild flushed feeling. This is normal. ?On occasion, you may experience a mild rash up to 24 hours after the test. This is not dangerous. If this occurs, you can take Benadryl 25 mg and increase your fluid intake. ?If you experience  trouble breathing, this can be serious. If it is severe call 911 IMMEDIATELY. If it is mild, please call our office. ?If you take any of these medications: Glipizide/Metformin, Avandament, Glucavance, please do not take 48 hours after completing test unless otherwise instructed. ? ?We will call to schedule your test 2-4 weeks out understanding that some insurance companies will need an authorization prior to the service being performed.  ? ?For non-scheduling related questions, please contact the cardiac imaging nurse navigator should you have any questions/concerns: ?Rockwell Alexandria, Cardiac Imaging Nurse Navigator ?Larey Brick, Cardiac Imaging Nurse Navigator ?[facility] ?Direct Office Dial: [phone]  ? ?For scheduling needs, including cancellations and rescheduling, please call [place], [phone]. ? ? ? ? ? ? ?  ?

## 2021-12-31 ENCOUNTER — Encounter: Payer: Self-pay | Admitting: Internal Medicine

## 2021-12-31 ENCOUNTER — Ambulatory Visit (HOSPITAL_COMMUNITY): Payer: 59 | Attending: Cardiology

## 2021-12-31 DIAGNOSIS — R072 Precordial pain: Secondary | ICD-10-CM | POA: Insufficient documentation

## 2021-12-31 DIAGNOSIS — R079 Chest pain, unspecified: Secondary | ICD-10-CM | POA: Insufficient documentation

## 2021-12-31 LAB — ECHOCARDIOGRAM COMPLETE
Area-P 1/2: 2.68 cm2
S' Lateral: 2.35 cm

## 2022-01-03 ENCOUNTER — Telehealth: Payer: Self-pay

## 2022-01-03 NOTE — Telephone Encounter (Signed)
Copied from [place] [id number]. Topic: General - Other ?>> Jan 03, 2022 11:25 AM Rayann Heman wrote: ?Reason for CRM: Leigh calling from [facility] called and stated that she need most recent OV notes faxed so she can get the PA for MRI. Please put Attention: Leigh on cover sheet.  ?[phone] ?

## 2022-01-03 NOTE — Telephone Encounter (Signed)
Copied from CRM #[id number]. Topic: General - Other ?>> Jan 03, 2022 11:25 AM [place], Jessica L wrote: ?Reason for CRM: Leigh calling from [facility] called and stated that she need most recent OV notes faxed so she can get the PA for MRI. Please put Attention: Leigh on cover sheet.  ?Fax:[phone] ?

## 2022-01-03 NOTE — Telephone Encounter (Signed)
OV note was faxed and confirmation page received.  ?

## 2022-01-03 NOTE — Telephone Encounter (Signed)
Cindy Rivera I have printed office note if you could fax them please  ?

## 2022-01-03 NOTE — Telephone Encounter (Signed)
Office notes has been faxed over to [facility] at listed number ? ?

## 2022-01-05 ENCOUNTER — Other Ambulatory Visit: Payer: Self-pay | Admitting: Internal Medicine

## 2022-01-05 MED ORDER — ROSUVASTATIN CALCIUM 10 MG PO TABS: 10.0000 mg | ORAL_TABLET | Freq: Every day | ORAL | 3 refills | Status: DC

## 2022-01-06 ENCOUNTER — Telehealth (HOSPITAL_COMMUNITY): Payer: Self-pay | Admitting: Emergency Medicine

## 2022-01-06 NOTE — Telephone Encounter (Signed)
Reaching out to patient to offer assistance regarding upcoming cardiac imaging study; pt verbalizes understanding of appt date/time, parking situation and where to check in, pre-test NPO status and medications ordered, and verified current allergies; name and call back number provided for further questions should they arise Rockwell Alexandria RN Navigator Cardiac Imaging [facility] [phone] office [phone] cell  HR 61, not taking metop Arrival 400 Denies iv issues

## 2022-01-07 ENCOUNTER — Ambulatory Visit (HOSPITAL_COMMUNITY)
Admission: RE | Admit: 2022-01-07 | Discharge: 2022-01-07 | Disposition: A | Discharge status: Home / Self Care | Payer: 59 | Source: Ambulatory Visit | Attending: Internal Medicine | Admitting: Internal Medicine

## 2022-01-07 DIAGNOSIS — R072 Precordial pain: Secondary | ICD-10-CM | POA: Insufficient documentation

## 2022-01-07 MED ORDER — NITROGLYCERIN 0.4 MG SL SUBL
0.8000 mg | SUBLINGUAL_TABLET | Freq: Once | SUBLINGUAL | Status: AC
  Administered 2022-01-07: 0.8 mg via SUBLINGUAL

## 2022-01-07 MED ORDER — NITROGLYCERIN 0.4 MG SL SUBL
SUBLINGUAL_TABLET | SUBLINGUAL | Status: AC
  Filled 2022-01-07: qty 2

## 2022-01-07 MED ORDER — IOHEXOL 350 MG/ML SOLN
95.0000 mL | Freq: Once | INTRAVENOUS | Status: AC | PRN
Start: 2022-01-07 — End: 2022-01-07
  Administered 2022-01-07: 95 mL via INTRAVENOUS

## 2022-01-13 ENCOUNTER — Encounter: Payer: Self-pay | Admitting: Internal Medicine

## 2022-01-13 ENCOUNTER — Ambulatory Visit: Payer: 59 | Attending: Internal Medicine | Admitting: Internal Medicine

## 2022-01-13 VITALS — BP 108/70 | HR 62 | Ht 61.0 in | Wt 145.4 lb

## 2022-01-13 DIAGNOSIS — Z1231 Encounter for screening mammogram for malignant neoplasm of breast: Secondary | ICD-10-CM

## 2022-01-13 DIAGNOSIS — Z1211 Encounter for screening for malignant neoplasm of colon: Secondary | ICD-10-CM

## 2022-01-13 DIAGNOSIS — Z86018 Personal history of other benign neoplasm: Secondary | ICD-10-CM | POA: Insufficient documentation

## 2022-01-13 DIAGNOSIS — K760 Fatty (change of) liver, not elsewhere classified: Secondary | ICD-10-CM | POA: Insufficient documentation

## 2022-01-13 DIAGNOSIS — E782 Mixed hyperlipidemia: Secondary | ICD-10-CM | POA: Diagnosis not present

## 2022-01-13 DIAGNOSIS — E039 Hypothyroidism, unspecified: Secondary | ICD-10-CM

## 2022-01-13 DIAGNOSIS — I700 Atherosclerosis of aorta: Secondary | ICD-10-CM | POA: Insufficient documentation

## 2022-01-13 DIAGNOSIS — M5416 Radiculopathy, lumbar region: Secondary | ICD-10-CM

## 2022-01-13 DIAGNOSIS — E663 Overweight: Secondary | ICD-10-CM | POA: Insufficient documentation

## 2022-01-13 DIAGNOSIS — Z780 Asymptomatic menopausal state: Secondary | ICD-10-CM

## 2022-01-13 NOTE — Progress Notes (Signed)
Discuss recent CT and MRI

## 2022-01-13 NOTE — Progress Notes (Signed)
Patient ID: Cindy Rivera, female    DOB: 1951-07-03  MRN: [id number]  CC: Knee Pain   Subjective: Cindy Rivera is a 71 y.o. female who presents for 6 weeks follow-up.  Daughter, Chartered certified accountant, is with her and gives most of the history and also interprets.  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   BL lumbar radiculopathy: On last visit she complained of chronic back pain that radiated down both legs and associated with numbness and tingling.  I prescribed lidocaine patch for her to use.  Daughter tells me that she has not been using it. -MRI obtained which was done at [facility].  This revealed multilevel lumbar spondylosis with varying degrees of canal and foraminal stenosis; severe canal stenosis at L4-L5 secondary to anterior listhesis with posterior disc bulge, facet hypertrophy and ligamentum flavum thickening; right paracentral disc herniation at L2-L3; varying degrees of multilevel mild to moderate foraminal stenosis as detailed above most advanced in the lower lumbar spine  Hx of CAD:  saw Dr. Wyline Mood since last visit.  negative CTA coronary with incidental finding of aortic atherosclerosis and moderate liver steatosis.  Coronary calcium score was 0 in coronary arteries did not show any blockages.  She also had echo that was normal.   Had swelling of face with Lipitor.  Daughter had sent me a Mychart message.  We stopped the Lipitor and swelling went away.  We change to Crestor to see if she tolerates.  Started 01/09/22.  So far so good.    Chronic cough/sore throat: On last visit she complained of chronic cough and sore throat.  She gave some symptoms of acid reflux and allergies/postnasal drip.  Advised that she continue the omeprazole which will be increased to twice a day.  She was also started on Claritin and Flonase  nasal spray.  Daughter tells me that she is not taking any of them stating that symptoms are chronic and patient does not have any symptoms of acid reflux at this time.  Daughter also had question about evaluation of patient's fibroid.  States that patient was diagnosed with fibroids when she was younger and she is worried about them.  Patient is now postmenopausal.  She has not had any postmenopausal bleeding.  BMI is in the range for obesity.  Daughter reports that patient loves sweets and does not eat much fruits.  She stays active in taking care of her grandson.  HM:  due for colon CA screen, MMG, osteoporosis screening with DEXA scan.  Showing due for Tdapt, shingles vaccine and pneumonia vaccines.  Daughter states that she had several vaccines a few years ago during her immigration evaluation.  One of them was for varicella.  She will bring her immunization records on next visit..   Patient Active Problem List   Diagnosis Date Noted   Hepatic steatosis 01/13/2022   Bilateral lumbar radiculopathy 12/04/2021   Chronic cough 12/04/2021   Chronic sore throat 12/04/2021   Gastroesophageal reflux disease without esophagitis 12/04/2021   CAD in native artery 12/04/2021   Abnormal TSH 12/04/2021     Current Outpatient Medications on File Prior to Visit  Medication Sig Dispense Refill   levothyroxine (SYNTHROID) 25 MCG tablet Take 1 tablet (25 mcg total) by mouth daily. 30 tablet 3   lidocaine (LIDODERM) 5 % Place 1 patch onto  the skin daily. Remove & Discard patch within 12 hours or as directed by MD 30 patch 1   loratadine (CLARITIN) 10 MG tablet Take 1 tablet (10 mg total) by mouth daily. 30 tablet 1   omeprazole (PRILOSEC) 40 MG capsule Take 1 capsule (40 mg total) by mouth daily. 90 capsule 1   rosuvastatin (CRESTOR) 10 MG tablet Take 1 tablet (10 mg total) by mouth daily. Stop Atorvastatin. 30 tablet 3   metoprolol tartrate (LOPRESSOR) 50 MG tablet Take 1 tablet (50 mg total) by mouth  once for 1 dose. PLEASE TAKE METOPROLOL 2  HOURS PRIOR TO CTA SCAN. 1 tablet 0   No current facility-administered medications on file prior to visit.    Allergies  Allergen Reactions   Berberine     Social History   Socioeconomic History   Marital status: Single    Spouse name: Not on file   Number of children: Not on file   Years of education: Not on file   Highest education level: Not on file  Occupational History   Not on file  Tobacco Use   Smoking status: Never   Smokeless tobacco: Never  Substance and Sexual Activity   Alcohol use: Never   Drug use: Never   Sexual activity: Not on file  Other Topics Concern   Not on file  Social History Narrative   Not on file   Social Determinants of Health   Financial Resource Strain: Not on file  Food Insecurity: Not on file  Transportation Needs: Not on file  Physical Activity: Not on file  Stress: Not on file  Social Connections: Not on file  Intimate Partner Violence: Not on file    Family History  Family history unknown: Yes    Past Surgical History:  Procedure Laterality Date   CATARACT EXTRACTION      ROS: Review of Systems Endo: Repeat thyroid studies showed TSH of 6.4 and decreasing free T4 to 0.71.  I recommended starting a low-dose of levothyroxine 25 mcg daily.  She has been taking that consistently.  PHYSICAL EXAM: BP 108/70   Pulse 62   Ht 5\' 1"  (1.549 m)   Wt 145 lb 6.4 oz (66 kg)   SpO2 97%   BMI 27.47 kg/m   Wt Readings from Last 3 Encounters:  01/13/22 145 lb 6.4 oz (66 kg)  12/20/21 147 lb 12.8 oz (67 kg)  12/03/21 145 lb 9.6 oz (66 kg)    Physical Exam  General appearance - alert, well appearing, and in no distress Mental status - normal mood, behavior, speech, dress, motor activity, and thought processes Mouth - mucous membranes moist, pharynx normal without lesions Neck - supple, no significant adenopathy Chest - clear to auscultation, no wheezes, rales or rhonchi, symmetric air  entry Heart - normal rate, regular rhythm, normal S1, S2, no murmurs, rubs, clicks or gallops Extremities -no lower extremity edema      Latest Ref Rng & Units 12/03/2021   11:05 AM  CMP  Glucose 70 - 99 mg/dL 98    BUN 8 - 27 mg/dL 13    Creatinine 4.540.57 - 1.00 mg/dL 0.980.87    Sodium 119134 - 147144 mmol/L 144    Potassium 3.5 - 5.2 mmol/L 5.0    Chloride 96 - 106 mmol/L 104    CO2 20 - 29 mmol/L 27    Calcium 8.7 - 10.3 mg/dL 9.6    Total Protein 6.0 - 8.5 g/dL 7.7    Total  Bilirubin 0.0 - 1.2 mg/dL 0.7    Alkaline Phos 44 - 121 IU/L 65    AST 0 - 40 IU/L 19    ALT 0 - 32 IU/L 15     Lipid Panel     Component Value Date/Time   CHOL 308 (H) 12/03/2021 1105   TRIG 87 12/03/2021 1105   HDL 60 12/03/2021 1105   CHOLHDL 5.1 (H) 12/03/2021 1105   LDLCALC 234 (H) 12/03/2021 1105    CBC    Component Value Date/Time   WBC 4.9 12/03/2021 1105   RBC 4.52 12/03/2021 1105   HGB 13.5 12/03/2021 1105   HCT 40.8 12/03/2021 1105   PLT 226 12/03/2021 1105   MCV 90 12/03/2021 1105   MCH 29.9 12/03/2021 1105   MCHC 33.1 12/03/2021 1105   RDW 12.4 12/03/2021 1105    ASSESSMENT AND PLAN: 1. Bilateral lumbar radiculopathy Explained MRI findings to patient and her daughter.  I recommend referral to a neurosurgeon for further evaluation and management - Ambulatory referral to Neurosurgery  2. Mixed hyperlipidemia Patient has significant elevation in total and LDL cholesterol.  She did not tolerate Lipitor due to facial swelling.  We are trying her with Crestor.  Advised that if she develops any swelling she should stop the medication.  Options at that point would be to discuss with the cardiologist about whether she would need PCSK9 inhibitor  3. Hepatic steatosis Encourage healthy eating habits and weight loss to get BMI within normal range.  Daughter would like referral to the gastroenterologist. - Ambulatory referral to Gastroenterology  4. Hypothyroidism (acquired) Continue  levothyroxine.  Recheck TSH today - TSH  5. History of uterine fibroid Given that she is postmenopausal and not having any symptoms or postmenopausal bleeding, I suggest doing nothing as the fibroids likely have decreased in size  6. Screening for colon cancer Discussed colon cancer screening methods.  They decided on having Cologuard instead of colonoscopy.  Patient is at average risk. - Cologuard  7. Encounter for screening mammogram for malignant neoplasm of breast - MM Digital Screening; Future  8. Postmenopausal estrogen deficiency Patient agreeable to bone density study. - DG Bone Density; Future  9.  Aortic atherosclerosis See #2 above  10.  Overweight BMI today is being calculated at 32.  On last visit with me BMI was 27.  After the patient had already left, I noted that the recorded height difference between the 2 visits is the cause of this significant increase in the BMI as her weight has not changed much. Discussed and  encourage healthy eating habits.  Encouraged her to snack on fruits or nuts rather than sweets.  Patient was given the opportunity to ask questions.  Patient verbalized understanding of the plan and was able to repeat key elements of the plan.   This documentation was completed using Paediatric nurse.  Any transcriptional errors are unintentional.  Orders Placed This Encounter  Procedures   MM Digital Screening   DG Bone Density   TSH   Cologuard   Ambulatory referral to Neurosurgery   Ambulatory referral to Gastroenterology     Requested Prescriptions    No prescriptions requested or ordered in this encounter    Return in about 4 months (around 05/16/2022).  Jonah Blue, MD, FACP

## 2022-01-14 ENCOUNTER — Other Ambulatory Visit: Payer: Self-pay | Admitting: Internal Medicine

## 2022-01-14 LAB — TSH: TSH: 5.12 u[IU]/mL — ABNORMAL HIGH (ref 0.450–4.500)

## 2022-01-14 MED ORDER — LEVOTHYROXINE SODIUM 25 MCG PO TABS: 37.5000 ug | ORAL_TABLET | Freq: Every day | ORAL | 3 refills | Status: AC

## 2022-01-18 ENCOUNTER — Telehealth: Payer: Self-pay | Admitting: Internal Medicine

## 2022-01-18 NOTE — Telephone Encounter (Signed)
-----   Message from Ena Dawley sent at 01/18/2022  9:56 AM EDT ----- Regarding: RE: Neurosurgery Referral Gm  I talked to her daughter  it took me awhile for her to understand that Friday don't have Neurosurgery in town . She want me to sent the referral to [place] [facility] $200  ----- Message ----- From: Ladell Pier, MD Sent: 01/15/2022   3:24 PM EDT To: Ena Dawley Subject: RE: Neurosurgery Referral                      Yes please do. You can call and speak with her daughter and let her know.  Daughter speaks english and translates for her mother when she comes to see me. ----- Message ----- From: Ena Dawley Sent: 01/14/2022  12:36 PM EDT To: Ladell Pier, MD Subject: Neurosurgery Referral                          Good Afternoon  Dr Wynetta Emery  Patient insurance Friday  only  have  Neurosurgery  like 3 hours away  do you want me to sent a letter to see if he can pay $200 [facility]  .  Please, advise  Thank you .    [facility] [place], [place] [id number]) [phone]

## 2022-01-19 ENCOUNTER — Encounter: Payer: Self-pay | Admitting: Gastroenterology

## 2022-01-24 LAB — HM MAMMOGRAPHY

## 2022-01-26 ENCOUNTER — Telehealth: Payer: Self-pay | Admitting: Internal Medicine

## 2022-01-26 DIAGNOSIS — M810 Age-related osteoporosis without current pathological fracture: Secondary | ICD-10-CM

## 2022-01-26 DIAGNOSIS — M818 Other osteoporosis without current pathological fracture: Secondary | ICD-10-CM

## 2022-01-26 NOTE — Telephone Encounter (Signed)
Phone call placed to patient's daughter Donata Clay this afternoon to go over results of recent studies.  I told her that I received results of her mammogram and that was normal. Bone density study that she had done through [facility] showed that she has osteoporosis at the femoral neck with T score of -2.5, Z score -0.7. T score -1.3 at lumbar spine and T score of -2.0 for the total hip. Advise that she purchase calcium/vitamin D 600 mg / 400 IU combination pill over-the-counter and have the patient take 1 tablet twice a day. I also discussed putting her on a bisphosphonate like Fosamax to take once a month.  Patient has GERD and is on omeprazole.  Informed that the Fosamax can cause stomach upset if not taken correctly and may make GERD worse.  Informed about Prolia which is a twice a year injection as an alternative.  Daughter would like to explore the Prolia inj.  Advise that I will refer to Endocrinology.

## 2022-01-26 NOTE — Telephone Encounter (Signed)
Bunny w/ [facility] making provider aware they have received referral for pt but does not accept pts insurance and will not be able to assist pt any further

## 2022-01-27 NOTE — Telephone Encounter (Signed)
FYI

## 2022-01-29 ENCOUNTER — Encounter: Payer: Self-pay | Admitting: Internal Medicine

## 2022-01-29 DIAGNOSIS — Z789 Other specified health status: Secondary | ICD-10-CM

## 2022-02-04 ENCOUNTER — Encounter: Payer: Self-pay | Admitting: Internal Medicine

## 2022-02-07 LAB — LIPID PANEL
Chol/HDL Ratio: 3.3 ratio (ref 0.0–4.4)
Cholesterol, Total: 199 mg/dL (ref 100–199)
HDL: 61 mg/dL (ref >39)
LDL Chol Calc (NIH): 126 mg/dL — ABNORMAL HIGH (ref 0–99)
Triglycerides: 66 mg/dL (ref 0–149)
VLDL Cholesterol Cal: 12 mg/dL (ref 5–40)

## 2022-02-16 ENCOUNTER — Encounter: Payer: Self-pay | Admitting: Internal Medicine

## 2022-02-17 ENCOUNTER — Telehealth: Payer: Self-pay | Admitting: Pharmacist

## 2022-02-17 ENCOUNTER — Encounter: Payer: Self-pay | Admitting: Pharmacist

## 2022-02-17 ENCOUNTER — Ambulatory Visit: Payer: 59 | Admitting: Pharmacist

## 2022-02-17 DIAGNOSIS — E7800 Pure hypercholesterolemia, unspecified: Secondary | ICD-10-CM | POA: Diagnosis not present

## 2022-02-17 DIAGNOSIS — I700 Atherosclerosis of aorta: Secondary | ICD-10-CM

## 2022-02-17 DIAGNOSIS — I2510 Atherosclerotic heart disease of native coronary artery without angina pectoris: Secondary | ICD-10-CM | POA: Diagnosis not present

## 2022-02-17 NOTE — Telephone Encounter (Signed)
PA submitted for Repatha.  Key: [id number]

## 2022-02-17 NOTE — Progress Notes (Unsigned)
Patient ID: Cindy Rivera                 DOB: 07/05/1951                    MRN: [id number]     HPI: Cindy Rivera is a 71 y.o. female patient referred to lipid clinic by Dr Wyline Mood. PMH is significant for CAD, aortic atherosclerosis, and statin intolerance. Patient has tried atorvastatin and rosuvastatin and both caused facial swelling which went away 2-3 days after discontinuing medication.  Patient presents today with daughter who acts as Nurse, learning disability. Very elevated LDL which decreased while on statin therapy but had to be discontinued. Coronary CT showed no calcium buildup.  Daughter knows of no significant family history of CAD or HLD however patient follows a mostly vegetarian diet.  Does have a sweet tooth however.  Current Medications: N/A  Intolerances:  Rosuvastatin Atorvastatin  Risk Factors:  HLD  LDL goal: <70  Labs:   TC 397 Trigs 87, HDL 60, LDL 234 (12/03/21 before statin)  TC 199, Trigs 66, HDL 61, LDL 126 (02/07/22 on statin)  Past Medical History:  Diagnosis Date   Back pain     Current Outpatient Medications on File Prior to Visit  Medication Sig Dispense Refill   levothyroxine (SYNTHROID) 25 MCG tablet Take 1.5 tablets (37.5 mcg total) by mouth daily. 45 tablet 3   lidocaine (LIDODERM) 5 % Place 1 patch onto the skin daily. Remove & Discard patch within 12 hours or as directed by MD 30 patch 1   loratadine (CLARITIN) 10 MG tablet Take 1 tablet (10 mg total) by mouth daily. 30 tablet 1   metoprolol tartrate (LOPRESSOR) 50 MG tablet Take 1 tablet (50 mg total) by mouth once for 1 dose. PLEASE TAKE METOPROLOL 2  HOURS PRIOR TO CTA SCAN. 1 tablet 0   omeprazole (PRILOSEC) 40 MG capsule Take 1 capsule (40 mg total) by mouth daily. 90 capsule 1   No current facility-administered medications on file prior to visit.    Allergies  Allergen Reactions   Berberine     Assessment/Plan:  1. Hyperlipidemia - Patient with elevated LDL 234 before statin therapy and 126  while on statins. Both above goal of <70. However does not coronary artery calcium.  Needs further lipid lowering. Unlikely to reach goal on Zetia although insurance may require a trial first.   Using demo pen, educated patient on mechanism of action, storage, site selection, administration and possible adverse effects. Patient and daughter willing to try. Will complete PA and contact patient. Patient should also be eligible for copay assistance. Recheck lipid panel in 2-3 months.  Cindy Rivera, PharmD, BCACP, CDCES, CPP [address] [place], [place], [address] Phone: [phone], Fax: [phone]

## 2022-02-17 NOTE — Patient Instructions (Addendum)
It was nice meeting you today  We would like to start a new medication called Repatha or Praluent which you will inject once every 2 weeks  I will complete the prior authorization for you and contact you when it is approved  Once you start the medication we will recheck your cholesterol in 2-3 months  Please call with any questions!   Laural Golden, PharmD, BCACP, CDCES, CPP [address] [place], [place], [address] Phone: [phone], Fax: [phone]

## 2022-02-18 ENCOUNTER — Encounter: Payer: Self-pay | Admitting: Gastroenterology

## 2022-02-18 ENCOUNTER — Other Ambulatory Visit (INDEPENDENT_AMBULATORY_CARE_PROVIDER_SITE_OTHER): Payer: 59

## 2022-02-18 ENCOUNTER — Ambulatory Visit: Payer: 59 | Admitting: Gastroenterology

## 2022-02-18 VITALS — BP 122/68 | HR 60 | Ht 62.0 in | Wt 145.2 lb

## 2022-02-18 DIAGNOSIS — K760 Fatty (change of) liver, not elsewhere classified: Secondary | ICD-10-CM

## 2022-02-18 LAB — IBC + FERRITIN
Ferritin: 128.3 ng/mL (ref 10.0–291.0)
Iron: 111 ug/dL (ref 42–145)
Saturation Ratios: 31.2 % (ref 20.0–50.0)
TIBC: 355.6 ug/dL (ref 250.0–450.0)
Transferrin: 254 mg/dL (ref 212.0–360.0)

## 2022-02-18 MED ORDER — REPATHA SURECLICK 140 MG/ML ~~LOC~~ SOAJ: 1.0000 mL | SUBCUTANEOUS | 11 refills | Status: DC

## 2022-02-18 NOTE — Addendum Note (Signed)
Addended by: Cheree Ditto on: 02/18/2022 10:40 AM   Modules accepted: Orders

## 2022-02-18 NOTE — Progress Notes (Signed)
Referring Provider: Marcine Matar, MD Primary Care Physician:  Marcine Matar, MD   Reason for Consultation:  Fatty liver   IMPRESSION:  Hepatis steatosis on imaging with normal liver enzymes and platelets. Suspected fatty liver given concurrent BMI 26 and hyperlipidemia.  Must exclude other common causes that mimic steatosis on imaging.  Labs and elastography recommended for evaluation and staging.  Intermittent RUQ pain. ? Related to hepatic caspule stretch. Patient is concerned about pancreatic cancer.  Low threshold to proceed with EGD if ultrasound is negative given her age.  There is no family history of GI malignancy.  Colon cancer screening.  The patient submitted a Cologuard earlier this week.  Results are pending.   PLAN: - labs to exclude concurrent liver disease: ferritin, iron, ANA, IgG, HBsAg, HBcAb Igm, HCV Ab, alpha-one-antitrypsin - NASH Fibrosure - Full abdominal ultrasound to include evaluation for pancreas and spleen with Elastography - maximize control of diabetes, work to maintain a healthy weight, control lipids  - brochure about fatty liver and NASH provided to the patient - I recommended the use of the UpToDate website for further information - Recommended abstinence from all beer, wine, liquor, and non-alcoholic beer - Low threshold to consider EGD to evaluate abdominal pain given age - Return to this clinic in 12 weeks or earlier as needed    HPI: Cindy Rivera is a 71 y.o. female referred by Dr. Laural Benes for hepatic steatosis.  The history is obtained through the patient with the help of her daughter who acts as her interpreter and review of her electronic health record.  She declined a formal Mandarin interpreter. She has a history of coronary artery disease, chronic back pain, reflux, allergies, hyperlipidemia, latent TB, and hypothyroidism, and lumbar radiculopathy. She is retired from Engineering geologist.   Coronary CT showed moderate hepatic steatosis.    Previously diagnosed with fatty liver a couple of years ago while in [place] but we don't have access to those records or details. Her daughter was not present at that time so is unable to help with that history.  First found to have a large liver and elevated liver enzymes in her 30s. No knowledge of what happened to liver enzymes after that time.   No prior blood donation.  No prior blood transfusion.  No history of jaundice or scleral icterus.  No history of use or experimentation with IV or intranasal street drugs.  No history of autoimmune disease.  No family history of liver disease or viral hepatitis.   No associated symptoms except for rare intermittent nonradiating right upper quadrant/right flank pain that is not related to movement, eating, or defecation.  Symptoms occur unprovoked.  No identified exacerbating or relieving features.  Normal liver enzymes 12/03/2021 Normal platelets 226 12/03/21 TSH elevated 01/13/2022 Cologuard pending. No prior endoscopic evaluation or colon cancer screening.   No recent abdominal imaging. No prior liver biopsy.    There is no known family history of colon cancer or polyps. No family history of stomach cancer or other GI malignancy. No family history of inflammatory bowel disease or celiac.    Past Medical History:  Diagnosis Date   Back pain    Fatty liver disease, nonalcoholic    Hypothyroidism     Past Surgical History:  Procedure Laterality Date   CATARACT EXTRACTION      Current Outpatient Medications  Medication Sig Dispense Refill   levothyroxine (SYNTHROID) 25 MCG tablet Take 1.5 tablets (37.5 mcg total) by mouth daily. 45  tablet 3   loratadine (CLARITIN) 10 MG tablet Take 1 tablet (10 mg total) by mouth daily. (Patient taking differently: Take 10 mg by mouth as needed.) 30 tablet 1   omeprazole (PRILOSEC) 40 MG capsule Take 1 capsule (40 mg total) by mouth daily. (Patient taking differently: Take 40 mg by mouth as needed.) 90  capsule 1   No current facility-administered medications for this visit.    Allergies as of 02/18/2022 - Review Complete 02/18/2022  Allergen Reaction Noted   Atorvastatin Swelling 02/17/2022   Berberine  10/09/2021   Rosuvastatin Swelling 02/17/2022    Family History  Problem Relation Age of Onset   Colon cancer Neg Hx    Stomach cancer Neg Hx    Esophageal cancer Neg Hx    Colon polyps Neg Hx     Social History   Socioeconomic History   Marital status: Single    Spouse name: Not on file   Number of children: 1   Years of education: Not on file   Highest education level: Not on file  Occupational History   Occupation: Retired  Tobacco Use   Smoking status: Never   Smokeless tobacco: Never  Vaping Use   Vaping Use: Never used  Substance and Sexual Activity   Alcohol use: Never   Drug use: Never   Sexual activity: Not on file  Other Topics Concern   Not on file  Social History Narrative   Not on file   Social Determinants of Health   Financial Resource Strain: Not on file  Food Insecurity: Not on file  Transportation Needs: Not on file  Physical Activity: Not on file  Stress: Not on file  Social Connections: Not on file  Intimate Partner Violence: Not on file    Review of Systems:` 12 system ROS is negative except as noted above with the addition of back pain.   Physical Exam: General:   Alert, in NAD. No scleral icterus. No bilateral temporal wasting.  Heart:  Regular rate and rhythm; no murmurs Pulm: Clear anteriorly; no wheezing Abdomen:  Soft. Nontender although she notes some RUQ pain that I cannot reproduce. Nondistended. Normal bowel sounds. No rebound or guarding. No fluid wave.  LAD: No inguinal or umbilical LAD Extremities:  Without edema. Neurologic:  Alert and  oriented x4;  grossly normal neurologically; no asterixis or clonus. Skin: No jaundice. No palmar erythema or spider angioma.  Psych:  Alert and cooperative. Normal mood and affect.      Cindy Rivera L. Orvan Falconer, MD, MPH 02/18/2022, 9:58 AM

## 2022-02-18 NOTE — Telephone Encounter (Signed)
Repatha PA approved through 02/18/23. Called daughter and [facility]

## 2022-02-18 NOTE — Patient Instructions (Addendum)
It was my pleasure to provide care to you today. Based on our discussion, I am providing you with my recommendations below:  RECOMMENDATION(S):   The fatty liver seen on cardiac imaging may be fatty liver.  Other causes must be considered.  I recommended some labs and an additional ultrasound to evaluate both for the cause and for potential damage related to chronic fatty liver.  We will include the pancreas in your ultrasound to further evaluate for your right upper quadrant pain.  Brochure about fatty liver and NASH provided to the patient.  I recommended the use of the UpToDate website for further information.  Recommended abstinence from all beer, wine, liquor, and non-alcoholic beer.  You may ultimately need an upper endoscopy if the ultrasound does not provide a cause for your intermittent right sided pain.  LABS:   Please proceed to the basement level for lab work before leaving today. Press "B" on the elevator. The lab is located at the first door on the left as you exit the elevator.  ABDOMINAL ULTRASOUND:  You have been scheduled for an abdominal ultrasound at [facility] (1st floor of hospital) on Friday 03/02/22 at 8 am. Please arrive 15 minutes prior to your appointment for registration. Make certain not to have anything to eat or drink 6 hours prior to your appointment. Should you need to reschedule your appointment, please contact radiology at [phone]. This test typically takes about 30 minutes to perform.   FOLLOW UP:  After your procedure, you will receive a call from my office staff regarding my recommendation for follow up.  BMI:  If you are age 71 or older, your body mass index should be between 23-30. Your Body mass index is 26.56 kg/m. If this is out of the aforementioned range listed, please consider follow up with your Primary Care Provider.    MY CHART:  The  GI providers would like to encourage you to use [facility] to communicate with  providers for non-urgent requests or questions.  Due to long hold times on the telephone, sending your provider a message by [facility] may be a faster and more efficient way to get a response.  Please allow 48 business hours for a response.  Please remember that this is for non-urgent requests.   Thank you for trusting me with your gastrointestinal care!    Tressia Danas, MD, MPH

## 2022-02-21 ENCOUNTER — Encounter: Payer: Self-pay | Admitting: Internal Medicine

## 2022-02-21 LAB — ALPHA-1-ANTITRYPSIN: A-1 Antitrypsin, Ser: 101 mg/dL (ref 83–199)

## 2022-02-21 LAB — IGG: IgG (Immunoglobin G), Serum: 1557 mg/dL — ABNORMAL HIGH (ref 600–1540)

## 2022-02-21 LAB — ANA: Anti Nuclear Antibody (ANA): POSITIVE — AB

## 2022-02-21 LAB — ANTI-NUCLEAR AB-TITER (ANA TITER): ANA Titer 1: 1:40 {titer} — ABNORMAL HIGH

## 2022-02-21 LAB — HEPATITIS C ANTIBODY: Hepatitis C Ab: NONREACTIVE

## 2022-02-21 LAB — HEPATITIS B CORE ANTIBODY, TOTAL: Hep B Core Total Ab: REACTIVE — AB

## 2022-02-21 LAB — HEPATITIS B SURFACE ANTIGEN: Hepatitis B Surface Ag: NONREACTIVE

## 2022-02-24 LAB — NASH FIBROSURE(R) PLUS
ALPHA 2-MACROGLOBULINS, QN: 216 mg/dL (ref 110–276)
ALT (SGPT) P5P: 20 IU/L (ref 0–40)
AST (SGOT) P5P: 21 IU/L (ref 0–40)
Apolipoprotein A-1: 158 mg/dL (ref 116–209)
Bilirubin, Total: 0.3 mg/dL (ref 0.0–1.2)
Cholesterol, Total: 273 mg/dL — ABNORMAL HIGH (ref 100–199)
Fibrosis Score: 0.22 — ABNORMAL HIGH (ref 0.00–0.21)
GGT: 12 IU/L (ref 0–60)
Glucose: 102 mg/dL — ABNORMAL HIGH (ref 70–99)
Haptoglobin: 56 mg/dL (ref 42–346)
NASH Score: 0.36 — ABNORMAL HIGH (ref 0.00–0.25)
Steatosis Score: 0.42 — ABNORMAL HIGH (ref 0.00–0.40)
Triglycerides: 133 mg/dL (ref 0–149)

## 2022-02-24 LAB — SPECIMEN STATUS REPORT

## 2022-02-25 ENCOUNTER — Other Ambulatory Visit (HOSPITAL_COMMUNITY): Payer: 59

## 2022-02-25 LAB — COLOGUARD: COLOGUARD: NEGATIVE

## 2022-03-02 ENCOUNTER — Ambulatory Visit (HOSPITAL_COMMUNITY)
Admission: RE | Admit: 2022-03-02 | Discharge: 2022-03-02 | Disposition: A | Discharge status: Home / Self Care | Payer: 59 | Source: Ambulatory Visit | Attending: Gastroenterology | Admitting: Gastroenterology

## 2022-03-02 DIAGNOSIS — K760 Fatty (change of) liver, not elsewhere classified: Secondary | ICD-10-CM | POA: Diagnosis present

## 2022-03-31 ENCOUNTER — Telehealth: Payer: Self-pay | Admitting: Gastroenterology

## 2022-03-31 ENCOUNTER — Ambulatory Visit: Payer: 59 | Admitting: Gastroenterology

## 2022-03-31 NOTE — Telephone Encounter (Signed)
Follow-up is not urgent but is needed. Please place on a cancellation list for an earlier appointment with me or Jill Side, whoever has an earlier appointment. Thanks.

## 2022-03-31 NOTE — Telephone Encounter (Signed)
Patient has been placed on cancellation list, daughter is aware. She has been offered an earlier appointment with Jill Side, however prefers to see MD only.

## 2022-04-08 ENCOUNTER — Encounter: Payer: Self-pay | Admitting: Internal Medicine

## 2022-04-08 ENCOUNTER — Ambulatory Visit: Payer: 59 | Admitting: Internal Medicine

## 2022-04-08 VITALS — BP 100/66 | HR 64 | Ht 61.0 in | Wt 145.8 lb

## 2022-04-08 DIAGNOSIS — R0602 Shortness of breath: Secondary | ICD-10-CM

## 2022-04-08 NOTE — Patient Instructions (Addendum)
Medication Instructions:  No Changes In Medications at this time.  *If you need a refill on your cardiac medications before your next appointment, please call your pharmacy*  Lab Work: BLOOD WORK TODAY  If you have labs (blood work) drawn today and your tests are completely normal, you will receive your results only by: MyChart Message (if you have MyChart) OR A paper copy in the mail If you have any lab test that is abnormal or we need to change your treatment, we will call you to review the results.  Testing/Procedures: Your physician has recommended that you have a pulmonary function test. Pulmonary Function Tests are a group of tests that measure how well air moves in and out of your lungs. SOMEONE WILL REACH OUT TO GET YOU SCHEDULED FOR THIS   Follow-Up: At [facility], you and your health needs are our priority.  As part of our continuing mission to provide you with exceptional heart care, we have created designated Provider Care Teams.  These Care Teams include your primary Cardiologist (physician) and Advanced Practice Providers (APPs -  Physician Assistants and Nurse Practitioners) who all work together to provide you with the care you need, when you need it.  Your next appointment:   6 month(s)  The format for your next appointment:   In Person  Provider:   Maisie Fus, MD

## 2022-04-08 NOTE — Progress Notes (Signed)
Cardiology Office Note:    Date:  04/08/2022   ID:  Cindy Rivera, DOB [birth year], MRN [id number]  PCP:  Marcine Matar, MD   [facility] Providers Cardiologist:  Maisie Fus, MD     Referring MD: No ref. provider found   No chief complaint on file. Chest pain  History of Present Illness:    Cindy Rivera is a 71 y.o. female with a hx of back pain, latent TB,  hypothyroidism , referral for ?atherosclerosis  [facility] referred to cardiology for atherosclerosis . She had a work up in [place] and told he had atherosclerosis.  She noted having an [place] diagnosed with atherosclerosis. He was started on aspirin and lipitor. He reported intermittent chest discomfort with exertion. When she walks she feels chest pressure and SOB.  Non smoker.  No orthopnea or PND. No LE edema. No DM2. Normal blood pressure  Interim Hx 04/08/2022 She had normal LV function on her echo. E/e' not elevated. No valve dx. Normal PA pressure. When she walks from the house to the gas station, she feels dyspnea. Over one year.  No significant weight gain. Noticed some swolling in her legs and face. No PND or orthopnea. She reported some Cp and dyspnea while sitting on the exam table  Cardiology Studies: TTE 12/31/2021: EF normal, RV normal, no significant valve dx  01/07/2022: Coronary CTA: CAC zero   Past Medical History:  Diagnosis Date   Back pain    Fatty liver disease, nonalcoholic    Hypothyroidism     Past Surgical History:  Procedure Laterality Date   CATARACT EXTRACTION      Current Medications: Current Meds  Medication Sig   Evolocumab (REPATHA SURECLICK) 140 MG/ML SOAJ Inject 1 mL into the skin every 14 (fourteen) days.   levothyroxine (SYNTHROID) 25 MCG tablet Take 1.5 tablets (37.5 mcg total) by mouth daily.   loratadine (CLARITIN) 10 MG tablet Take 1 tablet (10 mg total) by mouth daily. (Patient taking differently: Take 10 mg by mouth as  needed.)   omeprazole (PRILOSEC) 40 MG capsule Take 1 capsule (40 mg total) by mouth daily. (Patient taking differently: Take 40 mg by mouth as needed.)     Allergies:   Atorvastatin, Berberine, and Rosuvastatin   Social History   Socioeconomic History   Marital status: Single    Spouse name: Not on file   Number of children: 1   Years of education: Not on file   Highest education level: Not on file  Occupational History   Occupation: Retired  Tobacco Use   Smoking status: Never   Smokeless tobacco: Never  Vaping Use   Vaping Use: Never used  Substance and Sexual Activity   Alcohol use: Never   Drug use: Never   Sexual activity: Not on file  Other Topics Concern   Not on file  Social History Narrative   Not on file   Social Determinants of Health   Financial Resource Strain: Not on file  Food Insecurity: Not on file  Transportation Needs: Not on file  Physical Activity: Not on file  Stress: Not on file  Social Connections: Not on file     Family History: The patient's unknown, no consistent follow up in [place]  ROS:   Please see the history of present illness.      All other systems reviewed and are negative.  EKGs/Labs/Other Studies Reviewed:    The following studies were  reviewed today:   EKG:  EKG is  ordered today.  The ekg ordered today demonstrates   12/20/2021-NSR  Recent Labs: 12/03/2021: ALT 15; BUN 13; Creatinine, Ser 0.87; Hemoglobin 13.5; Platelets 226; Potassium 5.0; Sodium 144 01/13/2022: TSH 5.120   Recent Lipid Panel    Component Value Date/Time   CHOL 273 (H) 02/18/2022 1022   TRIG 133 02/18/2022 1022   HDL 61 02/07/2022 1043   CHOLHDL 3.3 02/07/2022 1043   LDLCALC 126 (H) 02/07/2022 1043     Risk Assessment/Calculations:           Physical Exam:    VS:    Vitals:   04/08/22 0839  BP: 100/66  Pulse: 64  SpO2: 98%       Wt Readings from Last 3 Encounters:  04/08/22 145 lb 12.8 oz (66.1 kg)  02/18/22 145 lb  3.2 oz (65.9 kg)  01/13/22 145 lb 6.4 oz (66 kg)     GEN:  Well nourished, well developed in no acute distress HEENT: Normal NECK: No JVD; LYMPHATICS: No lymphadenopathy CARDIAC: RRR, no murmurs, rubs, gallops RESPIRATORY:  Clear to auscultation without rales, wheezing or rhonchi  ABDOMEN: Soft, non-tender, non-distended MUSCULOSKELETAL:  No edema; No deformity  SKIN: Warm and dry NEUROLOGIC:  Alert and oriented x 3 PSYCHIATRIC:  Normal affect   ASSESSMENT:   Non cardiac CP: coronary CTA is 0  SOB: She continues to have DOE/CP with activity. She reported it in the clinic as well and was comfortable. With normal CAC, this is not coronary related. Her echo did not show signs of elevated LVEDP. She has no clinical signs of CHF. Low suspicion this is cardiac related. Will get PFTs today. Does have hx of latent TB. I discussed with her daughter that if PFTs are normal, may be related to anxiety  HLD: statin intolerant, CAC 0 prior to repatha, can continue repatha with elevated LDL 234 mg/dL. Follows with pharmacy clinic here.   PLAN:    In order of problems listed above:  PFTs, will send to her PCP Follow up in 6 mo         Medication Adjustments/Labs and Tests Ordered: Current medicines are reviewed at length with the patient today.  Concerns regarding medicines are outlined above.  Orders Placed This Encounter  Procedures   Pulmonary function test   No orders of the defined types were placed in this encounter.   Patient Instructions  Medication Instructions:  No Changes In Medications at this time.  *If you need a refill on your cardiac medications before your next appointment, please call your pharmacy*  Lab Work: BLOOD WORK TODAY  If you have labs (blood work) drawn today and your tests are completely normal, you will receive your results only by: MyChart Message (if you have MyChart) OR A paper copy in the mail If you have any lab test that is abnormal or we need to  change your treatment, we will call you to review the results.  Testing/Procedures: Your physician has recommended that you have a pulmonary function test. Pulmonary Function Tests are a group of tests that measure how well air moves in and out of your lungs. SOMEONE WILL REACH OUT TO GET YOU SCHEDULED FOR THIS   Follow-Up: At [facility], you and your health needs are our priority.  As part of our continuing mission to provide you with exceptional heart care, we have created designated Provider Care Teams.  These Care Teams include your primary Cardiologist (physician)  and Advanced Practice Providers (APPs -  Physician Assistants and Nurse Practitioners) who all work together to provide you with the care you need, when you need it.  Your next appointment:   6 month(s)  The format for your next appointment:   In Person  Provider:   Maisie Fus, MD             Signed, Maisie Fus, MD  04/08/2022 9:12 AM    [facility]

## 2022-04-09 ENCOUNTER — Other Ambulatory Visit: Payer: Self-pay | Admitting: Internal Medicine

## 2022-04-09 DIAGNOSIS — M5416 Radiculopathy, lumbar region: Secondary | ICD-10-CM

## 2022-04-13 ENCOUNTER — Other Ambulatory Visit: Payer: Self-pay | Admitting: Endocrinology

## 2022-04-13 DIAGNOSIS — E010 Iodine-deficiency related diffuse (endemic) goiter: Secondary | ICD-10-CM

## 2022-04-27 ENCOUNTER — Ambulatory Visit
Admission: RE | Admit: 2022-04-27 | Discharge: 2022-04-27 | Disposition: A | Discharge status: Home / Self Care | Payer: Commercial Managed Care - HMO | Source: Ambulatory Visit | Attending: Endocrinology | Admitting: Endocrinology

## 2022-04-27 DIAGNOSIS — E010 Iodine-deficiency related diffuse (endemic) goiter: Secondary | ICD-10-CM

## 2022-05-03 ENCOUNTER — Encounter: Payer: Self-pay | Admitting: Pharmacist

## 2022-05-11 ENCOUNTER — Telehealth: Payer: Self-pay | Admitting: Pharmacist

## 2022-05-11 NOTE — Telephone Encounter (Signed)
Per patient's daughter, new plan prefers [employer].  Submitting PA. Key: [id number]

## 2022-05-18 ENCOUNTER — Other Ambulatory Visit (INDEPENDENT_AMBULATORY_CARE_PROVIDER_SITE_OTHER): Payer: Commercial Managed Care - HMO

## 2022-05-18 ENCOUNTER — Encounter: Payer: Self-pay | Admitting: Gastroenterology

## 2022-05-18 ENCOUNTER — Ambulatory Visit: Payer: Commercial Managed Care - HMO | Admitting: Gastroenterology

## 2022-05-18 VITALS — BP 110/70 | HR 60 | Ht 60.0 in | Wt 148.0 lb

## 2022-05-18 DIAGNOSIS — R768 Other specified abnormal immunological findings in serum: Secondary | ICD-10-CM

## 2022-05-18 DIAGNOSIS — K760 Fatty (change of) liver, not elsewhere classified: Secondary | ICD-10-CM

## 2022-05-18 LAB — PROTIME-INR
INR: 1 ratio (ref 0.8–1.0)
Prothrombin Time: 11 s (ref 9.6–13.1)

## 2022-05-18 MED ORDER — OMEPRAZOLE 40 MG PO CPDR: 40.0000 mg | DELAYED_RELEASE_CAPSULE | Freq: Two times a day (BID) | ORAL | 1 refills | Status: DC

## 2022-05-18 NOTE — Progress Notes (Signed)
Referring Provider: Marcine Matar, MD Primary Care Physician:  Marcine Matar, MD   Chief Complaint:  Fatty liver   IMPRESSION:  Hepatis steatosis on imaging with normal liver enzymes and platelets. However, fatty liver not seen on recent elastography and recent labs suggest the possibility of concurrent autoimmune hepatitis. She also has autoimmune disease with hypothyroidism.  Liver biopsy recommended for clarification.  The patient is concerned about the procedure and requested repeating the labs that showed her risk for autoimmune hepatitis prior to proceeding with biopsy.  Hepatitis B Core antibody positive. Surface antigen negative. Liver enzymes are normal.  Confirmatory labs planned.  Her daughter specifically requested hepatitis B surface antibody test to understand vaccine status.  We discussed that this may not change management as her positive core antibody suggests prior exposure/immunity.  Intermittent RUQ pain today described more as bloating. ? Related to hepatic caspule stretch. Patient is concerned about pancreatic cancer.  Low threshold to proceed with EGD if ultrasound is negative given her age.  There is no family history of GI malignancy. We discussed resuming omeprazole - but she wasn't sure her symptoms are frequent enough.   Colon cancer screening.  The patient submitted a Cologuard earlier this week.  Results are pending.   PLAN: - HBV DNA viral load, HbSAb - ANA, IgG - PT/INR - Liver biopsy in radiology - Resume omeprazole 40 mg QD - Low threshold to consider EGD to evaluate abdominal pain given age - Return to this clinic after liver biopsy to review the results    HPI: Cindy Rivera is a 71 y.o. female returns in follow-up for evaluation of hepatic steatosis.  The history is obtained through the patient with the help of her daughter who acts as her interpreter and review of her electronic health record.  She declined a formal Mandarin interpreter.  She has a history of coronary artery disease, chronic back pain, reflux, allergies, hyperlipidemia, latent TB, and hypothyroidism, and lumbar radiculopathy. She is retired from Engineering geologist.   Please see my 02/18/22 consult note for complete details. Briefly, recent coronary CT showed moderate hepatic steatosis. First found to have a large liver and elevated liver enzymes in her 30s. No knowledge of what happened to liver enzymes after that time. And, she was previously diagnosed with fatty liver a couple of years ago while in [place] but we don't have access to those records or details. Her daughter was not present at that time so is unable to help with that history.  She returns today to review the results of labs and an ultrasound with elastography performed to evaluate her fatty liver.   Normal liver enzymes 12/03/2021 Normal platelets 226 12/03/21 TSH elevated 01/13/2022 Abdominal ultrasound with elastography 03/02/2022 showed a normal-appearing liver, median K PA 2.5 Alpha-1 antitrypsin level normal Hepatitis B core antibody positive Hepatitis B surface antigen negative Hepatitis C nonreactive IgG 1557 ANA positive at 1-40 with a nuclear, speckled pattern Ferritin 128 Nash FibroSure showed N1 mild Nash, S1 mild steatosis, F2 0 to F1 fibrosis  Cologuard - 02/17/2022  No prior liver biopsy.   She also reports some post-prandial bloating that occurs within an hour of eating and will last a couple of hours.  This happens intermittently.  She does not feel it happens frequently enough to require daily therapy.  There is no family history of liver disease.  There is no known family history of colon cancer or polyps. No family history of stomach cancer or other GI malignancy.  No family history of inflammatory bowel disease or celiac.    Past Medical History:  Diagnosis Date   Back pain    Fatty liver disease, nonalcoholic    Hypothyroidism     Past Surgical History:  Procedure Laterality Date    CATARACT EXTRACTION      Current Outpatient Medications  Medication Sig Dispense Refill   levothyroxine (SYNTHROID) 25 MCG tablet Take 1.5 tablets (37.5 mcg total) by mouth daily. 45 tablet 3   loratadine (CLARITIN) 10 MG tablet Take 1 tablet (10 mg total) by mouth daily. (Patient taking differently: Take 10 mg by mouth as needed.) 30 tablet 1   omeprazole (PRILOSEC) 40 MG capsule Take 1 capsule (40 mg total) by mouth daily. (Patient taking differently: Take 40 mg by mouth as needed.) 90 capsule 1   Evolocumab (REPATHA SURECLICK) 643 MG/ML SOAJ Inject 1 mL into the skin every 14 (fourteen) days. (Patient not taking: Reported on 05/18/2022) 2 mL 11   No current facility-administered medications for this visit.    Allergies as of 05/18/2022 - Review Complete 05/18/2022  Allergen Reaction Noted   Atorvastatin Swelling 02/17/2022   Berberine  10/09/2021   Rosuvastatin Swelling 02/17/2022   Latex Swelling 05/18/2022    Family History  Problem Relation Age of Onset   Colon cancer Neg Hx    Stomach cancer Neg Hx    Esophageal cancer Neg Hx    Colon polyps Neg Hx       Physical Exam: General:   Alert, in NAD. No scleral icterus. No bilateral temporal wasting.  Heart:  Regular rate and rhythm; no murmurs Pulm: Clear anteriorly; no wheezing Abdomen:  Soft. Nontender although she notes some RUQ pain that I cannot reproduce. Nondistended. Normal bowel sounds. No rebound or guarding. No fluid wave.  LAD: No inguinal or umbilical LAD Extremities:  Without edema. Neurologic:  Alert and  oriented x4;  grossly normal neurologically; no asterixis or clonus. Skin: No jaundice. No palmar erythema or spider angioma.  Psych:  Alert and cooperative. Normal mood and affect.     Texanna Hilburn L. Tarri Glenn, MD, MPH 05/18/2022, 8:44 AM

## 2022-05-18 NOTE — Patient Instructions (Addendum)
It was my pleasure to provide care to you today. Based on our discussion, I am providing you with my recommendations below:  RECOMMENDATION(S):   We discussed a liver biopsy today.   You requested repeating the labs today. Please stop in the lab today.   I recommend that you start taking omeprazole every daily to see if this helps your bloating and abdominal pain. Whe you use these medicine, I recommend taking it every day for at least 8 weeks to see if it's better.   I would like to see you after the biopsy to discuss the results.  FOLLOW UP:  After your procedure, you will receive a call from my office staff regarding my recommendation for follow up.  BMI:  If you are age 68 or older, your body mass index should be between 23-30. Your Body mass index is 28.9 kg/m. If this is out of the aforementioned range listed, please consider follow up with your Primary Care Provider.  If you are age 38 or younger, your body mass index should be between 19-25. Your Body mass index is 28.9 kg/m. If this is out of the aformentioned range listed, please consider follow up with your Primary Care Provider.   MY CHART:  The [facility] providers would like to encourage you to use [facility] to communicate with providers for non-urgent requests or questions.  Due to long hold times on the telephone, sending your provider a message by [facility] may be a faster and more efficient way to get a response.  Please allow 48 business hours for a response.  Please remember that this is for non-urgent requests.   Thank you for trusting me with your gastrointestinal care!    Thornton Park, MD, MPH

## 2022-05-22 ENCOUNTER — Other Ambulatory Visit: Payer: Self-pay | Admitting: Radiology

## 2022-05-22 DIAGNOSIS — R7989 Other specified abnormal findings of blood chemistry: Secondary | ICD-10-CM

## 2022-05-23 NOTE — H&P (Signed)
Chief Complaint: Patient was seen in consultation today for hepatic steatosis   at the request of Beavers,Kimberly  Referring Physician(s): Beavers,Kimberly  Supervising Physician: Jacqulynn Cadet  Patient Status: [facility] - Out-pt  History of Present Illness: Cindy Rivera is a 71 y.o. female presented to PCP complaining of intermittent RUQ pain and bloating referred to gastroenterology for evaluation. Pt reports previous diagnosis of fatty liver while in [place]. Pt US imaging 03/02/22 shows hepatic steatosis with normal liver enzymes and platelets. She had Hep B core antibody positive, surface antigen negative and Hep C non-reactive. 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 Pt has been referred to IR by Dr. Thornton Park for random liver biopsy.   Past Medical History:  Diagnosis Date   Back pain    Fatty liver disease, nonalcoholic    Hypothyroidism     Past Surgical History:  Procedure Laterality Date   CATARACT EXTRACTION      Allergies: Atorvastatin, Berberine, Rosuvastatin, and Latex  Medications: Prior to Admission medications   Medication Sig Start Date End Date Taking? Authorizing Provider  Evolocumab (REPATHA SURECLICK) [id number] MG/ML SOAJ Inject 1 mL into the skin every 14 (fourteen) days. Patient not taking: Reported on 05/18/2022 02/18/22   Janina Mayo, MD  levothyroxine (SYNTHROID) 25 MCG tablet Take 1.5 tablets (37.5 mcg total) by mouth daily. 01/14/22   Ladell Pier, MD  loratadine (CLARITIN) 10 MG tablet Take 1 tablet (10 mg total) by mouth daily. Patient taking differently: Take 10 mg by mouth as needed. 12/03/21   Ladell Pier, MD  omeprazole (PRILOSEC) 40 MG capsule Take 1 capsule (40 mg total) by mouth 2 (two) times daily. 05/18/22   Thornton Park, MD     Family History  Problem Relation Age of Onset   Colon cancer Neg Hx    Stomach cancer Neg Hx    Esophageal cancer Neg Hx    Colon polyps Neg  Hx     Social History   Socioeconomic History   Marital status: Single    Spouse name: Not on file   Number of children: 1   Years of education: Not on file   Highest education level: Not on file  Occupational History   Occupation: Retired  Tobacco Use   Smoking status: Never   Smokeless tobacco: Never  Vaping Use   Vaping Use: Never used  Substance and Sexual Activity   Alcohol use: Never   Drug use: Never   Sexual activity: Not on file  Other Topics Concern   Not on file  Social History Narrative   Not on file   Social Determinants of Health   Financial Resource Strain: Not on file  Food Insecurity: Not on file  Transportation Needs: Not on file  Physical Activity: Not on file  Stress: Not on file  Social Connections: Not on file   Review of Systems: A 12 point ROS discussed and pertinent positives are indicated in the HPI above.  All other systems are negative.  Review of Systems  Constitutional:  Negative for chills and fever.  Respiratory:  Negative for shortness of breath.   Cardiovascular:  Positive for chest pain.  Gastrointestinal:  Negative for abdominal pain, nausea and vomiting.  Psychiatric/Behavioral:  The patient is nervous/anxious.     Vital Signs:  111/66, HR 55, RR 12, 98.3, 96% RA    Physical Exam Vitals reviewed.  Constitutional:      General: She is  not in acute distress.    Appearance: Normal appearance. She is not ill-appearing.  HENT:     Head: Normocephalic and atraumatic.     Mouth/Throat:     Mouth: Mucous membranes are dry.     Pharynx: Oropharynx is clear.  Eyes:     Extraocular Movements: Extraocular movements intact.     Pupils: Pupils are equal, round, and reactive to light.  Cardiovascular:     Rate and Rhythm: Normal rate and regular rhythm.     Pulses: Normal pulses.     Heart sounds: Normal heart sounds.  Pulmonary:     Effort: Pulmonary effort is normal. No respiratory distress.     Breath sounds: Normal  breath sounds.  Abdominal:     General: Bowel sounds are normal. There is no distension.     Palpations: Abdomen is soft.     Tenderness: There is no abdominal tenderness. There is no guarding.  Musculoskeletal:     Right lower leg: No edema.     Left lower leg: No edema.  Skin:    General: Skin is warm.  Neurological:     Mental Status: She is alert and oriented to person, place, and time.  Psychiatric:        Mood and Affect: Mood normal.        Behavior: Behavior normal.        Thought Content: Thought content normal.        Judgment: Judgment normal.     Imaging: US THYROID  Result Date: 04/27/2022 CLINICAL DATA:  Thyromegaly EXAM: THYROID ULTRASOUND TECHNIQUE: Ultrasound examination of the thyroid gland and adjacent soft tissues was performed. COMPARISON:  None available FINDINGS: Parenchymal Echotexture: Moderately heterogeneous Isthmus: 0.7 cm Right lobe: 4.7 x 2.4 x 2.0 cm Left lobe: 5.4 x 2.0 x 2.1 cm _________________________________________________________ Estimated total number of nodules >/= 1 cm: 0 Number of spongiform nodules >/=  2 cm not described below (TR1): 0 Number of mixed cystic and solid nodules >/= 1.5 cm not described below (TR2): 0 _________________________________________________________ No discrete nodules are seen within the thyroid gland. IMPRESSION: Diffusely heterogeneous thyroid parenchyma without discrete nodule. The above is in keeping with the ACR TI-RADS recommendations - J Am Coll Radiol 2017;14:587-595. Electronically Signed   By: Miachel Roux M.D.   On: 04/27/2022 14:48    Labs:  CBC: Recent Labs    12/03/21 1105  WBC 4.9  HGB 13.5  HCT 40.8  PLT 226    COAGS: Recent Labs    05/18/22 0941  INR 1.0    BMP: Recent Labs    12/03/21 1105  NA 144  K 5.0  CL 104  CO2 27  GLUCOSE 98  BUN 13  CALCIUM 9.6  CREATININE 0.87    LIVER FUNCTION TESTS: Recent Labs    12/03/21 1105  BILITOT 0.7  AST 19  ALT 15  ALKPHOS 65  PROT  7.7  ALBUMIN 4.8    TUMOR MARKERS: No results for input(s): "AFPTM", "CEA", "CA199", "CHROMGRNA" in the last 8760 hours.  Assessment and Plan: 71 yo female presents to IR for random liver biopsy with moderate sedation.   Exam was performed through patient's daughter who interpreted at patient request.  Pt resting on stretcher.  She is A&O. She is in no distress, but anxious.  She denies use of blood thinners.   Risks and benefits of random liver biopsy was discussed with the patient and/or patient's family including, but not limited to bleeding, infection,  damage to adjacent structures or low yield requiring additional tests.  All of the questions were answered and there is agreement to proceed.  Consent signed and in chart.   Thank you for this interesting consult.  I greatly enjoyed meeting Cindy Rivera and look forward to participating in their care.  A copy of this report was sent to the requesting provider on this date.  Electronically Signed: Tyson Alias, NP 05/24/2022, 12:26 PM   I spent a total of 20 minutes in face to face in clinical consultation, greater than 50% of which was counseling/coordinating care for hepatic steatosis.

## 2022-05-24 ENCOUNTER — Ambulatory Visit (HOSPITAL_COMMUNITY)
Admission: RE | Admit: 2022-05-24 | Discharge: 2022-05-24 | Disposition: A | Discharge status: Home / Self Care | Payer: Commercial Managed Care - HMO | Source: Ambulatory Visit | Attending: Gastroenterology | Admitting: Gastroenterology

## 2022-05-24 ENCOUNTER — Encounter (HOSPITAL_COMMUNITY): Payer: Self-pay

## 2022-05-24 DIAGNOSIS — K760 Fatty (change of) liver, not elsewhere classified: Secondary | ICD-10-CM | POA: Insufficient documentation

## 2022-05-24 DIAGNOSIS — R768 Other specified abnormal immunological findings in serum: Secondary | ICD-10-CM | POA: Diagnosis not present

## 2022-05-24 DIAGNOSIS — R140 Abdominal distension (gaseous): Secondary | ICD-10-CM | POA: Insufficient documentation

## 2022-05-24 DIAGNOSIS — Z01812 Encounter for preprocedural laboratory examination: Secondary | ICD-10-CM | POA: Insufficient documentation

## 2022-05-24 DIAGNOSIS — R7989 Other specified abnormal findings of blood chemistry: Secondary | ICD-10-CM | POA: Diagnosis not present

## 2022-05-24 LAB — COMPREHENSIVE METABOLIC PANEL
ALT: 18 U/L (ref 0–44)
AST: 22 U/L (ref 15–41)
Albumin: 4.1 g/dL (ref 3.5–5.0)
Alkaline Phosphatase: 45 U/L (ref 38–126)
Anion gap: 5 (ref 5–15)
BUN: 16 mg/dL (ref 8–23)
CO2: 27 mmol/L (ref 22–32)
Calcium: 9 mg/dL (ref 8.9–10.3)
Chloride: 110 mmol/L (ref 98–111)
Creatinine, Ser: 0.71 mg/dL (ref 0.44–1.00)
GFR, Estimated: >60 mL/min (ref >60)
Glucose, Bld: 95 mg/dL (ref 70–99)
Potassium: 3.6 mmol/L (ref 3.5–5.1)
Sodium: 142 mmol/L (ref 135–145)
Total Bilirubin: 1.1 mg/dL (ref 0.3–1.2)
Total Protein: 7 g/dL (ref 6.5–8.1)

## 2022-05-24 LAB — CBC WITH DIFFERENTIAL/PLATELET
Abs Immature Granulocytes: 0.01 10*3/uL (ref 0.00–0.07)
Basophils Absolute: 0 10*3/uL (ref 0.0–0.1)
Basophils Relative: 0 %
Eosinophils Absolute: 0.2 10*3/uL (ref 0.0–0.5)
Eosinophils Relative: 4 %
HCT: 37.2 % (ref 36.0–46.0)
Hemoglobin: 12 g/dL (ref 12.0–15.0)
Immature Granulocytes: 0 %
Lymphocytes Relative: 48 %
Lymphs Abs: 2.2 10*3/uL (ref 0.7–4.0)
MCH: 30.4 pg (ref 26.0–34.0)
MCHC: 32.3 g/dL (ref 30.0–36.0)
MCV: 94.2 fL (ref 80.0–100.0)
Monocytes Absolute: 0.4 10*3/uL (ref 0.1–1.0)
Monocytes Relative: 8 %
Neutro Abs: 1.9 10*3/uL (ref 1.7–7.7)
Neutrophils Relative %: 40 %
Platelets: 175 10*3/uL (ref 150–400)
RBC: 3.95 MIL/uL (ref 3.87–5.11)
RDW: 12.4 % (ref 11.5–15.5)
WBC: 4.6 10*3/uL (ref 4.0–10.5)
nRBC: 0 % (ref 0.0–0.2)

## 2022-05-24 LAB — HEPATITIS B DNA, ULTRAQUANTITATIVE, PCR
Hepatitis B DNA: NOT DETECTED IU/mL
Hepatitis B virus DNA: NOT DETECTED Log IU/mL

## 2022-05-24 LAB — ANA: Anti Nuclear Antibody (ANA): POSITIVE — AB

## 2022-05-24 LAB — ANTI-NUCLEAR AB-TITER (ANA TITER): ANA Titer 1: 1:40 {titer} — ABNORMAL HIGH

## 2022-05-24 LAB — HEPATITIS B SURFACE ANTIBODY,QUALITATIVE: Hep B S Ab: REACTIVE — AB

## 2022-05-24 LAB — TEST AUTHORIZATION

## 2022-05-24 LAB — PROTIME-INR
INR: 1 (ref 0.8–1.2)
Prothrombin Time: 12.9 seconds (ref 11.4–15.2)

## 2022-05-24 LAB — IGG: IgG (Immunoglobin G), Serum: 1813 mg/dL — ABNORMAL HIGH (ref 600–1540)

## 2022-05-24 MED ORDER — FENTANYL CITRATE (PF) 100 MCG/2ML IJ SOLN
INTRAMUSCULAR | Status: AC | PRN
  Administered 2022-05-24 (×2): 50 ug via INTRAVENOUS

## 2022-05-24 MED ORDER — FENTANYL CITRATE (PF) 100 MCG/2ML IJ SOLN
INTRAMUSCULAR | Status: AC
  Filled 2022-05-24: qty 2

## 2022-05-24 MED ORDER — MIDAZOLAM HCL 2 MG/2ML IJ SOLN
INTRAMUSCULAR | Status: AC | PRN
  Administered 2022-05-24: .5 mg via INTRAVENOUS
  Administered 2022-05-24: 1 mg via INTRAVENOUS

## 2022-05-24 MED ORDER — MIDAZOLAM HCL 2 MG/2ML IJ SOLN
INTRAMUSCULAR | Status: AC
  Filled 2022-05-24: qty 2

## 2022-05-24 MED ORDER — LIDOCAINE HCL (PF) 1 % IJ SOLN
INTRAMUSCULAR | Status: AC | PRN
  Administered 2022-05-24: 10 mL

## 2022-05-24 MED ORDER — LIDOCAINE HCL 1 % IJ SOLN
INTRAMUSCULAR | Status: AC
  Administered 2022-05-24: 10 mL
  Filled 2022-05-24: qty 20

## 2022-05-24 MED ORDER — SODIUM CHLORIDE 0.9 % IV SOLN: INTRAVENOUS | Status: DC

## 2022-05-24 MED ORDER — GELATIN ABSORBABLE 12-7 MM EX MISC
CUTANEOUS | Status: AC
  Administered 2022-05-24: 1
  Filled 2022-05-24: qty 1

## 2022-05-24 NOTE — Discharge Instructions (Signed)
Liver Biopsy, Care After  May remove dressing or bandaid and shower tomorrow.  Keep site clean and dry. Replace with clean dressing or bandaid as necessary.    Interventional Radiology clinic [phone]  After a liver biopsy, it is common to have these things in the area where the biopsy was done. You may: Have pain. Feel sore. Have bruising. You may also feel tired for a few days. Follow these instructions at home: Medicines Take over-the-counter and prescription medicines only as told by your doctor. If you were prescribed an antibiotic medicine, take it as told by your doctor. Do not stop taking the antibiotic, even if you start to feel better. Do not take medicines that may thin your blood. These medicines include aspirin and ibuprofen. Take them only if your doctor tells you to. If told, take steps to prevent problems with pooping (constipation). You may need to: Drink enough fluid to keep your pee (urine) pale yellow. Take medicines. You will be told what medicines to take. Eat foods that are high in fiber. These include beans, whole grains, and fresh fruits and vegetables. Limit foods that are high in fat and sugar. These include fried or sweet foods. Ask your doctor if you should avoid driving or using machines while you are taking your medicine. Caring for your incision Follow instructions from your doctor about how to take care of your cut from surgery (incisions). Make sure you: Wash your hands with soap and water for at least 20 seconds before and after you change your bandage. If you cannot use soap and water, use hand sanitizer. Change your bandage. Leavestitches or skin glue in place for at least two weeks. Leave tape strips alone unless you are told to take them off. You may trim the edges of the tape strips if they curl up. Check your incision every day for signs of infection. Check for: Redness, swelling, or more pain. Fluid or blood. Warmth. Pus or a bad smell. Do  not take baths, swim, or use a hot tub. Ask your doctor about taking showers or sponge baths. Activity Rest at home for 1-2 days, or as told by your doctor. Get up to take short walks every 1 to 2 hours. Ask for help if you feel weak or unsteady. Do not lift anything that is heavier than 10 lb (4.5 kg), or the limit that you are told. Do not play contact sports for 2 weeks after the procedure. Return to your normal activities as told by your doctor. Ask what activities are safe for you. General instructions Do not drink alcohol in the first week after the procedure. Plan to have a responsible adult care for you for the time you are told after you leave the hospital or clinic. This is important. It is up to you to get the results of your procedure. Ask how to get your results when they are ready. Keep all follow-up visits.   Contact a doctor if: You have more bleeding in your incision. Your incision swells, or is red and more painful. You have fluid that comes from your incision. You develop a rash. You have fever or chills. Get help right away if: You have swelling, bloating, or pain in your belly (abdomen). You get dizzy or faint. You vomit or you feel like vomiting. You have trouble breathing or feel short of breath. You have chest pain. You have problems talking or seeing. You have trouble with your balance or moving your arms or legs. These  symptoms may be an emergency. Get help right away. Call your local emergency services (911 in the [place] Do not wait to see if the symptoms will go away. Do not drive yourself to the hospital. Summary After the procedure, it is common to have pain, soreness, bruising, and tiredness. Your doctor will tell you how to take care of yourself at home. Change your bandage, take your medicines, and limit your activities as told by your doctor. Call your doctor if you have symptoms of infection. Get help right away if your belly swells, your cut bleeds  a lot, or you have trouble talking or breathing. This information is not intended to replace advice given to you by your health care provider. Make sure you discuss any questions you have with your healthcare provider. Document Revised: 06/22/2020 Document Reviewed: 06/22/2020 Elsevier Patient Education  2022 [place].  Moderate Conscious Sedation, Adult, Care After This sheet gives you information about how to care for yourself after your procedure. Your health care provider may also give you more specific instructions. If you have problems or questions, contact your health careprovider. What can I expect after the procedure? After the procedure, it is common to have: Sleepiness for several hours. Impaired judgment for several hours. Difficulty with balance. Vomiting if you eat too soon. Follow these instructions at home: For the time period you were told by your health care provider: Rest. Do not participate in activities where you could fall or become injured. Do not drive or use machinery. Do not drink alcohol. Do not take sleeping pills or medicines that cause drowsiness. Do not make important decisions or sign legal documents. Do not take care of children on your own. Eating and drinking  Follow the diet recommended by your health care provider. Drink enough fluid to keep your urine pale yellow. If you vomit: Drink water, juice, or soup when you can drink without vomiting. Make sure you have little or no nausea before eating solid foods.  General instructions Take over-the-counter and prescription medicines only as told by your health care provider. Have a responsible adult stay with you for the time you are told. It is important to have someone help care for you until you are awake and alert. Do not smoke. Keep all follow-up visits as told by your health care provider. This is important. Contact a health care provider if: You are still sleepy or having trouble with  balance after 24 hours. You feel light-headed. You keep feeling nauseous or you keep vomiting. You develop a rash. You have a fever. You have redness or swelling around the IV site. Get help right away if: You have trouble breathing. You have new-onset confusion at home. Summary After the procedure, it is common to feel sleepy, have impaired judgment, or feel nauseous if you eat too soon. Rest after you get home. Know the things you should not do after the procedure. Follow the diet recommended by your health care provider and drink enough fluid to keep your urine pale yellow. Get help right away if you have trouble breathing or new-onset confusion at home. This information is not intended to replace advice given to you by your health care provider. Make sure you discuss any questions you have with your healthcare provider. Document Revised: 12/06/2019 Document Reviewed: 07/04/2019 Elsevier Patient Education  2022 [employer].

## 2022-05-24 NOTE — Progress Notes (Signed)
Pt signs waiver for [place] translator to allow daughter Magdalene Molly Daughtry [phone]) to translate.  [employer]pre[employer] used to explain and interpret form to Pt prior to signing (interpreter Levada Dy [phone] used) at 1145.

## 2022-05-24 NOTE — Procedures (Signed)
Interventional Radiology Procedure Note  Procedure: US guided random liver biopsy  Complications: None  Estimated Blood Loss: None  Recommendations: - Bedrest x 2 hrs - DC home   Signed,  Linh Johannes K. Kyshawn Teal, MD    

## 2022-05-25 ENCOUNTER — Telehealth: Payer: Self-pay | Admitting: Pharmacist

## 2022-05-25 NOTE — Telephone Encounter (Signed)
Submitted PA for Praluent on 9/20. Have not heard response yet. Called [employer] who said PA was still under medical review. Asked representative to expedite review.

## 2022-05-26 LAB — SURGICAL PATHOLOGY

## 2022-05-27 ENCOUNTER — Telehealth: Payer: Self-pay | Admitting: Internal Medicine

## 2022-05-27 NOTE — Telephone Encounter (Signed)
Copied from [place] [phone]. Topic: Referral - Request for Referral >> May 27, 2022 11:58 AM Sabas Sous wrote: Has patient seen PCP for this complaint? Yes.   *If NO, is insurance requiring patient see PCP for this issue before PCP can refer them? Referral for which specialty: Neurosurgery  Preferred provider/office: [facility]  Reason for referral: Bilateral Lumbar Radiculopathy   PT's daughter Henderson [place] called to provide all of this information below.  New Insurance information: Christella Scheuermann ID: [id number]  Plan Name: [facility]  Account number: [id number]: [id number]  EXMDY: [place] number: [phone]

## 2022-05-31 ENCOUNTER — Telehealth: Payer: Self-pay | Admitting: Pharmacist

## 2022-05-31 DIAGNOSIS — I700 Atherosclerosis of aorta: Secondary | ICD-10-CM

## 2022-05-31 DIAGNOSIS — E7800 Pure hypercholesterolemia, unspecified: Secondary | ICD-10-CM

## 2022-05-31 DIAGNOSIS — I2510 Atherosclerotic heart disease of native coronary artery without angina pectoris: Secondary | ICD-10-CM

## 2022-05-31 NOTE — Telephone Encounter (Signed)
Resending PA for Praluent.  Key: [id number]

## 2022-06-10 ENCOUNTER — Other Ambulatory Visit: Payer: Self-pay

## 2022-06-10 DIAGNOSIS — I700 Atherosclerosis of aorta: Secondary | ICD-10-CM

## 2022-06-10 DIAGNOSIS — I2510 Atherosclerotic heart disease of native coronary artery without angina pectoris: Secondary | ICD-10-CM

## 2022-06-10 DIAGNOSIS — E7800 Pure hypercholesterolemia, unspecified: Secondary | ICD-10-CM

## 2022-06-13 ENCOUNTER — Encounter: Payer: Self-pay | Admitting: Internal Medicine

## 2022-06-13 LAB — LIPID PANEL
Chol/HDL Ratio: 2.3 ratio (ref 0.0–4.4)
Cholesterol, Total: 187 mg/dL (ref 100–199)
HDL: 83 mg/dL (ref >39)
LDL Chol Calc (NIH): 86 mg/dL (ref 0–99)
Triglycerides: 100 mg/dL (ref 0–149)
VLDL Cholesterol Cal: 18 mg/dL (ref 5–40)

## 2022-06-14 ENCOUNTER — Ambulatory Visit: Payer: Commercial Managed Care - HMO | Admitting: Gastroenterology

## 2022-06-14 ENCOUNTER — Encounter: Payer: Self-pay | Admitting: Gastroenterology

## 2022-06-14 VITALS — BP 104/58 | HR 72 | Ht 60.63 in | Wt 152.0 lb

## 2022-06-14 DIAGNOSIS — R768 Other specified abnormal immunological findings in serum: Secondary | ICD-10-CM | POA: Diagnosis not present

## 2022-06-14 DIAGNOSIS — R1011 Right upper quadrant pain: Secondary | ICD-10-CM

## 2022-06-14 DIAGNOSIS — K760 Fatty (change of) liver, not elsewhere classified: Secondary | ICD-10-CM

## 2022-06-14 NOTE — Patient Instructions (Signed)
Continue Omeprazole 40 mg daily 30-60 minutes before breakfast.   You have been scheduled for an endoscopy. Please follow written instructions given to you at your visit today. If you use inhalers (even only as needed), please bring them with you on the day of your procedure.  _______________________________________________________  If you are age 71 or older, your body mass index should be between 23-30. Your Body mass index is 29.07 kg/m. If this is out of the aforementioned range listed, please consider follow up with your Primary Care Provider.  If you are age 63 or younger, your body mass index should be between 19-25. Your Body mass index is 29.07 kg/m. If this is out of the aformentioned range listed, please consider follow up with your Primary Care Provider.   ________________________________________________________  The [facility] providers would like to encourage you to use [facility] to communicate with providers for non-urgent requests or questions.  Due to long hold times on the telephone, sending your provider a message by [facility] may be a faster and more efficient way to get a response.  Please allow 48 business hours for a response.  Please remember that this is for non-urgent requests.  _______________________________________________________

## 2022-06-14 NOTE — Progress Notes (Signed)
Referring Provider: Ladell Pier, MD Primary Care Physician:  Ladell Pier, MD   Chief Complaint:  Fatty liver   IMPRESSION:  Fatty liver without steatohepatitis, fibrosis, or concurrent autoimmune hepatitis. We reviewed the diagnosis and treatment strategies. Liver biopsy excludes significant steatohepatitis or concurrent autoimmune hepatitis.  Elevated IgG. No associated autoimmune hepatitis. Will review with Dr. Wynetta Emery to determine next steps in evaluation.    Resolved HBV infection. Hepatitis B Core antibody positive. Surface antigen negative.  DNA viral load undetectable.  No further follow-up needed at this time.   Intermittent RUQ pain today described more as bloating. Now resolved. ? Related to hepatic caspule stretch although liver biopsy findings are very mild.  Low threshold to proceed with EGD if ultrasound is negative given her age and to exclude H pylori, gastritis, esophagitis, and even malignancy. Patient and daughter remain concerned about the etiology.  Colon cancer screening.  Cologuard negative 02/17/22. No further screening indicated.    PLAN: - Continue omeprazole 40 mg QD - Input with Dr. Wynetta Emery re: elevated IgG evaluation - EGD with biopsies to evaluate RUQ pain - Consider cross-sectional imaging if EGD is non-diagnostic and pain persists - Return to this clinic PRN   HPI: Cindy Rivera is a 71 y.o. female returns in follow-up for evaluation of hepatic steatosis.  The history is obtained through the patient with the help of her daughter who acts as her interpreter and review of her electronic health record.  She declined a formal Mandarin interpreter. She has a history of coronary artery disease, chronic back pain, reflux, allergies, hyperlipidemia, latent TB, and hypothyroidism, and lumbar radiculopathy. She is retired from [employer] research (medical).   Please see my 02/18/22 consult note for complete details. Briefly, recent coronary CT showed moderate hepatic  steatosis. First found to have a large liver and elevated liver enzymes in her 79s. No knowledge of what happened to liver enzymes after that time. And, she was previously diagnosed with fatty liver a couple of years ago while in [place] but we don't have access to those records or details. Her daughter was not present at that time so is unable to help with that history.  Normal liver enzymes 12/03/2021 Normal platelets 226 12/03/21 TSH elevated 01/13/2022 Abdominal ultrasound with elastography 03/02/2022 showed a normal-appearing liver, median K PA 2.5 Alpha-1 antitrypsin level normal Hepatitis B core antibody positive Hepatitis B surface antibody positive Hepatitis B surface antigen negative Hepatitis B DNA viral load undetectable Hepatitis C nonreactive IgG 1557 on repeat testing 3 months later it was 1813 ANA positive at 1-40 with a nuclear, speckled pattern Ferritin 128 Nash FibroSure showed N1 mild Nash, S1 mild steatosis, F2 0 to F1 fibrosis  Liver biopsy 05/24/2022 showed mild steatosis with a predominantly macro and focal microvesicular pattern.  No features of autoimmune hepatitis.  There is minimal portal fibrosis.  Cologuard - 02/17/2022  She returns today to review the results of her liver biopsy.  She tolerated the biopsy without complaint.  She developed a slight reaction to the adhesive used on the Band-Aid covering the biopsy site.  She has continued to have some intermittent right upper quadrant pain that is overall unchanged from on prior visits.   Past Medical History:  Diagnosis Date   Back pain    Fatty liver disease, nonalcoholic    Hypothyroidism     Past Surgical History:  Procedure Laterality Date   CATARACT EXTRACTION      Current Outpatient Medications  Medication Sig Dispense Refill  levothyroxine (SYNTHROID) 25 MCG tablet Take 1.5 tablets (37.5 mcg total) by mouth daily. 45 tablet 3   loratadine (CLARITIN) 10 MG tablet Take 1 tablet (10 mg total) by mouth  daily. (Patient taking differently: Take 10 mg by mouth as needed.) 30 tablet 1   Multiple Vitamins-Minerals (MULTI FOR HER) TABS Take 1 tablet by mouth as needed.     omeprazole (PRILOSEC) 40 MG capsule Take 1 capsule (40 mg total) by mouth 2 (two) times daily. (Patient taking differently: Take 40 mg by mouth as needed.) 180 capsule 1   Oyster Shell Calcium 500 MG TABS Take 1 tablet by mouth daily.     zoledronic acid (RECLAST) 5 MG/100ML SOLN injection Inject 5 mg into the vein once. (Patient not taking: Reported on 06/14/2022)     No current facility-administered medications for this visit.    Allergies as of 06/14/2022 - Review Complete 06/14/2022  Allergen Reaction Noted   Latex Swelling 05/18/2022   Berberine  10/09/2021   Crestor [rosuvastatin] Swelling 02/17/2022   Lipitor [atorvastatin] Swelling 02/17/2022   Repatha [evolocumab] Swelling 06/14/2022    Family History  Problem Relation Age of Onset   Colon cancer Neg Hx    Stomach cancer Neg Hx    Esophageal cancer Neg Hx    Colon polyps Neg Hx       Physical Exam: General:   Alert, in NAD. No scleral icterus. No bilateral temporal wasting.  Heart:  Regular rate and rhythm; no murmurs Pulm: Clear anteriorly; no wheezing Abdomen:  Soft. Nontender although she notes some RUQ pain that I cannot reproduce. Well healed incision at the location of liver biopsy. Nondistended. Normal bowel sounds. No rebound or guarding. No fluid wave.  LAD: No inguinal or umbilical LAD Extremities:  Without edema. Neurologic:  Alert and  oriented x4;  grossly normal neurologically; no asterixis or clonus. Skin: No jaundice. No palmar erythema or spider angioma.  Psych:  Alert and cooperative. Normal mood and affect.   I spent 42 minutes, including in depth chart review, independent review of results, communicating results with the patient directly, face-to-face time with the patient, coordinating care, and ordering studies and medications as  appropriate, and documentation.    Drevon Plog L. Tarri Glenn, MD, MPH 06/14/2022, 9:26 AM

## 2022-06-21 ENCOUNTER — Ambulatory Visit: Payer: Commercial Managed Care - HMO | Attending: Internal Medicine | Admitting: Internal Medicine

## 2022-06-21 ENCOUNTER — Encounter: Payer: Self-pay | Admitting: Internal Medicine

## 2022-06-21 VITALS — BP 112/73 | HR 68 | Wt 153.4 lb

## 2022-06-21 DIAGNOSIS — Z23 Encounter for immunization: Secondary | ICD-10-CM | POA: Diagnosis not present

## 2022-06-21 DIAGNOSIS — R0609 Other forms of dyspnea: Secondary | ICD-10-CM

## 2022-06-21 DIAGNOSIS — R768 Other specified abnormal immunological findings in serum: Secondary | ICD-10-CM

## 2022-06-21 DIAGNOSIS — R400 Somnolence: Secondary | ICD-10-CM | POA: Diagnosis not present

## 2022-06-21 DIAGNOSIS — M810 Age-related osteoporosis without current pathological fracture: Secondary | ICD-10-CM | POA: Diagnosis not present

## 2022-06-21 DIAGNOSIS — T7840XD Allergy, unspecified, subsequent encounter: Secondary | ICD-10-CM

## 2022-06-21 DIAGNOSIS — R0683 Snoring: Secondary | ICD-10-CM

## 2022-06-21 DIAGNOSIS — K760 Fatty (change of) liver, not elsewhere classified: Secondary | ICD-10-CM | POA: Diagnosis not present

## 2022-06-21 NOTE — Telephone Encounter (Signed)
Spoke with daughter Cindy Rivera who wanted her 2 questions answered.  1. Patient's cholesterol is WNL. She wants to know if patient still needs to take Repatha.  2. Patient has reacted to [place] with facial swelling. Patient is now being considered for PAP for Praluent. [employer] rejected the application because supporting documents were not included with the prior authorization for [place] and Praluent. These need to be submitted.  Please advise on status of prior auth.

## 2022-06-21 NOTE — Progress Notes (Unsigned)
Patient ID: Cindy Rivera, female    DOB: 01/14/1951  MRN: [id number]  CC: Sleep Apnea   Subjective: Cindy Rivera is a 71 y.o. female who presents for chronic ds management.  Daughter, Heritage manager, is with her and gives most of the history and also interprets. Her concerns today include:  Hx of CAD, chronic back pain, GERD, Allergies, HL, chronic cough and latent TB (completed 4 mths of treatment in [place]), hypothyroid (dx 11/2021).   CAD:  had S.E to Repatha end of August Swelling face and eye. Daughter sent message to Dr. Harl Bowie Repeat Lipid nl.  Saw Dr. Modena Nunnery ? Autoimmune liver ds.  Had liver bx.  Showed mild inflammation/fatty liver  Patient Active Problem List   Diagnosis Date Noted   Pure hypercholesterolemia 02/17/2022   Osteoporosis 01/26/2022   Hepatic steatosis 01/13/2022   Aortic atherosclerosis ([place]) 01/13/2022   Overweight (BMI 25.0-29.9) 01/13/2022   History of uterine fibroid 01/13/2022   Hypothyroidism (acquired) 01/13/2022   Bilateral lumbar radiculopathy 12/04/2021   Chronic cough 12/04/2021   Chronic sore throat 12/04/2021   Gastroesophageal reflux disease without esophagitis 12/04/2021   CAD in native artery 12/04/2021   Abnormal TSH 12/04/2021     Current Outpatient Medications on File Prior to Visit  Medication Sig Dispense Refill   levothyroxine (SYNTHROID) 25 MCG tablet Take 1.5 tablets (37.5 mcg total) by mouth daily. 45 tablet 3   loratadine (CLARITIN) 10 MG tablet Take 1 tablet (10 mg total) by mouth daily. (Patient taking differently: Take 10 mg by mouth as needed.) 30 tablet 1   Multiple Vitamins-Minerals (MULTI FOR HER) TABS Take 1 tablet by mouth as needed.     omeprazole (PRILOSEC) 40 MG capsule Take 1 capsule (40 mg total) by mouth 2 (two) times daily. (Patient taking differently: Take 40 mg by mouth as needed.) 180 capsule 1   Oyster Shell Calcium 500 MG TABS Take 1 tablet by mouth daily.     zoledronic acid (RECLAST) 5 MG/100ML  SOLN injection Inject 5 mg into the vein once. (Patient not taking: Reported on 06/14/2022)     No current facility-administered medications on file prior to visit.    Allergies  Allergen Reactions   Latex Swelling    Facial swelling   Berberine    Crestor [Rosuvastatin] Swelling    Facial swelling   Lipitor [Atorvastatin] Swelling    Facial swelling   Repatha [Evolocumab] Swelling    Swelling of face    Social History   Socioeconomic History   Marital status: Single    Spouse name: Not on file   Number of children: 1   Years of education: Not on file   Highest education level: Not on file  Occupational History   Occupation: Retired  Tobacco Use   Smoking status: Never   Smokeless tobacco: Never  Vaping Use   Vaping Use: Never used  Substance and Sexual Activity   Alcohol use: Never   Drug use: Never   Sexual activity: Not on file  Other Topics Concern   Not on file  Social History Narrative   Not on file   Social Determinants of Health   Financial Resource Strain: Not on file  Food Insecurity: Not on file  Transportation Needs: Not on file  Physical Activity: Not on file  Stress: Not on file  Social Connections: Not on file  Intimate Partner Violence: Not on file    Family History  Problem Relation Age of Onset  Colon cancer Neg Hx    Stomach cancer Neg Hx    Esophageal cancer Neg Hx    Colon polyps Neg Hx     Past Surgical History:  Procedure Laterality Date   CATARACT EXTRACTION      ROS: Review of Systems Negative except as stated above  PHYSICAL EXAM: BP 112/73 (BP Location: Right Arm, Patient Position: Sitting, Cuff Size: Normal)   Pulse 68   Wt 153 lb 6.4 oz (69.6 kg)   SpO2 94%   BMI 29.34 kg/m   Physical Exam  {female adult master:310786} {female adult master:310785}     Latest Ref Rng & Units 05/24/2022   12:52 PM 12/03/2021   11:05 AM  CMP  Glucose 70 - 99 mg/dL 95  98   BUN 8 - 23 mg/dL 16  13   Creatinine 6.16 - 1.00  mg/dL 0.73  7.10   Sodium 626 - 145 mmol/L 142  144   Potassium 3.5 - 5.1 mmol/L 3.6  5.0   Chloride 98 - 111 mmol/L 110  104   CO2 22 - 32 mmol/L 27  27   Calcium 8.9 - 10.3 mg/dL 9.0  9.6   Total Protein 6.5 - 8.1 g/dL 7.0  7.7   Total Bilirubin 0.3 - 1.2 mg/dL 1.1  0.7   Alkaline Phos 38 - 126 U/L 45  65   AST 15 - 41 U/L 22  19   ALT 0 - 44 U/L 18  15    Lipid Panel     Component Value Date/Time   CHOL 187 06/13/2022 1130   CHOL 273 (H) 02/18/2022 1022   TRIG 100 06/13/2022 1130   TRIG 133 02/18/2022 1022   HDL 83 06/13/2022 1130   CHOLHDL 2.3 06/13/2022 1130   LDLCALC 86 06/13/2022 1130    CBC    Component Value Date/Time   WBC 4.6 05/24/2022 1252   RBC 3.95 05/24/2022 1252   HGB 12.0 05/24/2022 1252   HGB 13.5 12/03/2021 1105   HCT 37.2 05/24/2022 1252   HCT 40.8 12/03/2021 1105   PLT 175 05/24/2022 1252   PLT 226 12/03/2021 1105   MCV 94.2 05/24/2022 1252   MCV 90 12/03/2021 1105   MCH 30.4 05/24/2022 1252   MCHC 32.3 05/24/2022 1252   RDW 12.4 05/24/2022 1252   RDW 12.4 12/03/2021 1105   LYMPHSABS 2.2 05/24/2022 1252   MONOABS 0.4 05/24/2022 1252   EOSABS 0.2 05/24/2022 1252   BASOSABS 0.0 05/24/2022 1252    ASSESSMENT AND PLAN:  There are no diagnoses linked to this encounter.   Patient was given the opportunity to ask questions.  Patient verbalized understanding of the plan and was able to repeat key elements of the plan.   This documentation was completed using Paediatric nurse.  Any transcriptional errors are unintentional.  No orders of the defined types were placed in this encounter.    Requested Prescriptions    No prescriptions requested or ordered in this encounter    No follow-ups on file.  Jonah Blue, MD, FACP

## 2022-07-01 ENCOUNTER — Ambulatory Visit (AMBULATORY_SURGERY_CENTER): Payer: Commercial Managed Care - HMO | Admitting: Gastroenterology

## 2022-07-01 ENCOUNTER — Encounter: Payer: Self-pay | Admitting: Gastroenterology

## 2022-07-01 VITALS — BP 100/63 | HR 60 | Temp 98.6°F | Resp 13 | Ht 60.0 in | Wt 152.0 lb

## 2022-07-01 DIAGNOSIS — K31A0 Gastric intestinal metaplasia, unspecified: Secondary | ICD-10-CM | POA: Diagnosis not present

## 2022-07-01 DIAGNOSIS — K319 Disease of stomach and duodenum, unspecified: Secondary | ICD-10-CM

## 2022-07-01 DIAGNOSIS — K259 Gastric ulcer, unspecified as acute or chronic, without hemorrhage or perforation: Secondary | ICD-10-CM

## 2022-07-01 DIAGNOSIS — K317 Polyp of stomach and duodenum: Secondary | ICD-10-CM

## 2022-07-01 DIAGNOSIS — K2950 Unspecified chronic gastritis without bleeding: Secondary | ICD-10-CM

## 2022-07-01 DIAGNOSIS — K760 Fatty (change of) liver, not elsewhere classified: Secondary | ICD-10-CM

## 2022-07-01 MED ORDER — SODIUM CHLORIDE 0.9 % IV SOLN: 500.0000 mL | Freq: Once | INTRAVENOUS | Status: DC

## 2022-07-01 MED ORDER — OMEPRAZOLE 40 MG PO CPDR: 40.0000 mg | DELAYED_RELEASE_CAPSULE | Freq: Two times a day (BID) | ORAL | 1 refills | Status: DC

## 2022-07-01 MED ORDER — SUCRALFATE 1 G PO TABS: 1.0000 g | ORAL_TABLET | Freq: Four times a day (QID) | ORAL | 0 refills | Status: DC

## 2022-07-01 NOTE — Progress Notes (Signed)
Called to room to assist during endoscopic procedure.  Patient ID and intended procedure confirmed with present staff. Received instructions for my participation in the procedure from the performing physician.  

## 2022-07-01 NOTE — Progress Notes (Signed)
To pacu, VSS. Report to [email or web address] 

## 2022-07-01 NOTE — Patient Instructions (Addendum)
Thank you for letting us take care of your healthcare needs today. No Aspirin products, Ibuprofen, Motrin Aleve. Office follow up on 09/01/22 at 11:10 with Dr. Orvan Falconer. Omeprazole 40 mg BID for 8 weeks then once a day.      YOU HAD AN ENDOSCOPIC PROCEDURE TODAY AT THE [facility]:   Refer to the procedure report that was given to you for any specific questions about what was found during the examination.  If the procedure report does not answer your questions, please call your gastroenterologist to clarify.  If you requested that your care partner not be given the details of your procedure findings, then the procedure report has been included in a sealed envelope for you to review at your convenience later.  YOU SHOULD EXPECT: Some feelings of bloating in the abdomen. Passage of more gas than usual.  Walking can help get rid of the air that was put into your GI tract during the procedure and reduce the bloating. If you had a lower endoscopy (such as a colonoscopy or flexible sigmoidoscopy) you may notice spotting of blood in your stool or on the toilet paper. If you underwent a bowel prep for your procedure, you may not have a normal bowel movement for a few days.  Please Note:  You might notice some irritation and congestion in your nose or some drainage.  This is from the oxygen used during your procedure.  There is no need for concern and it should clear up in a day or so.  SYMPTOMS TO REPORT IMMEDIATELY:   Following upper endoscopy (EGD)  Vomiting of blood or coffee ground material  New chest pain or pain under the shoulder blades  Painful or persistently difficult swallowing  New shortness of breath  Fever of 100F or higher  Black, tarry-looking stools  For urgent or emergent issues, a gastroenterologist can be reached at any hour by calling [phone]. Do not use MyChart messaging for urgent concerns.    DIET:  We do recommend a small meal at first, but then you  may proceed to your regular diet.  Drink plenty of fluids but you should avoid alcoholic beverages for 24 hours.  ACTIVITY:  You should plan to take it easy for the rest of today and you should NOT DRIVE or use heavy machinery until tomorrow (because of the sedation medicines used during the test).    FOLLOW UP: Our staff will call the number listed on your records the next business day following your procedure.  We will call around 7:15- 8:00 am to check on you and address any questions or concerns that you may have regarding the information given to you following your procedure. If we do not reach you, we will leave a message.     If any biopsies were taken you will be contacted by phone or by letter within the next 1-3 weeks.  Please call us at [phone] if you have not heard about the biopsies in 3 weeks.    SIGNATURES/CONFIDENTIALITY: You and/or your care partner have signed paperwork which will be entered into your electronic medical record.  These signatures attest to the fact that that the information above on your After Visit Summary has been reviewed and is understood.  Full responsibility of the confidentiality of this discharge information lies with you and/or your care-partner.

## 2022-07-01 NOTE — Progress Notes (Signed)
Indication for procedure: RUQ pain  Please see my 06/14/22 office note for complete details. There has been no change in history or physical exam since that time. She remains an appropriate candidate for monitored anesthesia care in the [facility].

## 2022-07-01 NOTE — Op Note (Addendum)
[facility] Patient Name: Cindy Rivera Procedure Date: 07/01/2022 10:43 AM MRN: [id number] Endoscopist: Tressia Danas MD, MD, [id number] Age: 71 Referring MD:  Date of Birth: 1951-01-14 Gender: Female Account #: [id number] Procedure:                Upper GI endoscopy Indications:              Abdominal pain in the right upper quadrant Medicines:                Monitored Anesthesia Care Procedure:                Pre-Anesthesia Assessment:                           - Prior to the procedure, a History and Physical                            was performed, and patient medications and                            allergies were reviewed. The patient's tolerance of                            previous anesthesia was also reviewed. The risks                            and benefits of the procedure and the sedation                            options and risks were discussed with the patient.                            All questions were answered, and informed consent                            was obtained. Prior Anticoagulants: The patient has                            taken no anticoagulant or antiplatelet agents. ASA                            Grade Assessment: II - A patient with mild systemic                            disease. After reviewing the risks and benefits,                            the patient was deemed in satisfactory condition to                            undergo the procedure.                           After obtaining informed consent, the endoscope was  passed under direct vision. Throughout the                            procedure, the patient's blood pressure, pulse, and                            oxygen saturations were monitored continuously. The                            GIF [id number] #[id number] was introduced through the                            mouth, and advanced to the third part of duodenum.                            The  upper GI endoscopy was accomplished without                            difficulty. The patient tolerated the procedure                            well. Scope In: Scope Out: Findings:                 The examined esophagus was normal except for mild                            congestion at the GE junction.                           Two localized medium erosions with no bleeding and                            no stigmata of recent bleeding were found in the                            gastric antrum. Biopsies were taken with a cold                            forceps for histology. Estimated blood loss was                            minimal.                           Multiple small sessile polyps were found in the                            gastric fundus and in the gastric body. These have                            the appearance of fundic gland polyps. Biopsies                            were taken  with a cold forceps for histology.                            Estimated blood loss was minimal.                           The remainder of the examined stomach was normal.                            Biopsies were taken from the antrum, body, and                            fundus with a cold forceps for histology. Estimated                            blood loss was minimal.                           Diffuse moderately erythematous mucosa without                            active bleeding was found in the duodenal bulb. Complications:            No immediate complications. Estimated Blood Loss:     Estimated blood loss was minimal. Impression:               - Normal esophagus.                           - Erosive gastropathy with no bleeding and no                            stigmata of recent bleeding. Biopsied.                           - Multiple gastric polyps. Biopsied.                           - Normal stomach. Biopsied.                           - Duodenitis. Recommendation:           -  Patient has a contact number available for                            emergencies. The signs and symptoms of potential                            delayed complications were discussed with the                            patient. Return to normal activities tomorrow.                            Written discharge instructions were provided to the  patient.                           - Resume previous diet.                           - Continue present medications.                           - Start omeprazole 40 mg twice daily for at least 8                            weeks, then reduce to 40 mg every day.                           - Await pathology results.                           - No aspirin, ibuprofen, naproxen, or other                            non-steroidal anti-inflammatory drugs.                           - Office follow-up in 4-6 weeks, earlier if needed. Tressia Danas MD, MD 07/01/2022 11:11:45 AM This report has been signed electronically.

## 2022-07-04 ENCOUNTER — Ambulatory Visit: Payer: Commercial Managed Care - HMO | Attending: Internal Medicine

## 2022-07-04 ENCOUNTER — Telehealth: Payer: Self-pay | Admitting: *Deleted

## 2022-07-04 DIAGNOSIS — M6281 Muscle weakness (generalized): Secondary | ICD-10-CM | POA: Diagnosis present

## 2022-07-04 DIAGNOSIS — Z23 Encounter for immunization: Secondary | ICD-10-CM

## 2022-07-04 DIAGNOSIS — M4316 Spondylolisthesis, lumbar region: Secondary | ICD-10-CM | POA: Diagnosis not present

## 2022-07-04 DIAGNOSIS — M5459 Other low back pain: Secondary | ICD-10-CM | POA: Diagnosis present

## 2022-07-04 NOTE — Therapy (Signed)
OUTPATIENT PHYSICAL THERAPY THORACOLUMBAR EVALUATION   Patient Name: Cindy Rivera MRN: [id number] DOB:1950-12-06, 71 y.o., female Today's Date: 07/04/2022    Past Medical History:  Diagnosis Date   Back pain    Fatty liver disease, nonalcoholic    Hypothyroidism    Past Surgical History:  Procedure Laterality Date   CATARACT EXTRACTION     Patient Active Problem List   Diagnosis Date Noted   Pure hypercholesterolemia 02/17/2022   Osteoporosis 01/26/2022   Hepatic steatosis 01/13/2022   Aortic atherosclerosis (HCC) 01/13/2022   Overweight (BMI 25.0-29.9) 01/13/2022   History of uterine fibroid 01/13/2022   Hypothyroidism (acquired) 01/13/2022   Bilateral lumbar radiculopathy 12/04/2021   Chronic cough 12/04/2021   Chronic sore throat 12/04/2021   Gastroesophageal reflux disease without esophagitis 12/04/2021   CAD in native artery 12/04/2021   Abnormal TSH 12/04/2021    PCP: Marcine Matar, MD   REFERRING PROVIDER: Kirby Funk, PA   REFERRING DIAG: [phone] (ICD-10-CM) - Spondylolisthesis, lumbar region  Rationale for Evaluation and Treatment: Rehabilitation  THERAPY DIAG: Spondylolisthesis, lumbar region  ONSET DATE: chronic  SUBJECTIVE:                                                                                                                                                                                           SUBJECTIVE STATEMENT: Describes low back and RLE pain.  Symptoms ongoing for 1 year.   PERTINENT HISTORY:  None available  PAIN:  Are you having pain? Yes: NPRS scale: 7/10 Pain location: low back and RLE Pain description: ache and  Aggravating factors: straining with bowel movement, walking,  Relieving factors: undetermined, advil  PRECAUTIONS: Back  WEIGHT BEARING RESTRICTIONS: No  FALLS:  Has patient fallen in last 6 months? No  LIVING ENVIRONMENT: Lives with: lives with their family  OCCUPATION: retired  PLOF:  Independent  PATIENT GOALS: To reduce and manage my back pain   NEXT MD VISIT:   OBJECTIVE:   DIAGNOSTIC FINDINGS:  Severe degenerative changes of the lower lumbar spine with anterolisthesis  of L4 and L5 measuring approximately 8 mm and severe bilateral facet  arthropathy.   Note that there is transitional anatomy, with vertebral level numbering as  above. Recommend careful anatomic correlation prior to any spinal  intervention.    PATIENT SURVEYS:  UTA due to language barrier  SCREENING FOR RED FLAGS: negative  COGNITION: Overall cognitive status: Within functional limits for tasks assessed     SENSATION: Not tested  MUSCLE LENGTH: Hamstrings: Right 90 deg; Left 90 deg  POSTURE: No Significant postural limitations  PALPATION: Not tested  LUMBAR ROM:  AROM eval  Flexion 90%  Extension 75%  Right lateral flexion   Left lateral flexion   Right rotation   Left rotation    (Blank rows = not tested)  LOWER EXTREMITY ROM:   WNL B   Active  Right eval Left eval  Hip flexion    Hip extension    Hip abduction    Hip adduction    Hip internal rotation    Hip external rotation    Knee flexion    Knee extension    Ankle dorsiflexion    Ankle plantarflexion    Ankle inversion    Ankle eversion     (Blank rows = not tested)  LOWER EXTREMITY MMT:    MMT Right eval Left eval  Hip flexion    Hip extension    Hip abduction    Hip adduction    Hip internal rotation    Hip external rotation    Knee flexion    Knee extension    Ankle dorsiflexion    Ankle plantarflexion    Ankle inversion    Ankle eversion    core 3 3   (Blank rows = not tested)  LUMBAR SPECIAL TESTS:  Prone instability test: Positive, Straight leg raise test: Negative, Slump test: Negative, and FABER test: Positive L hip capsule tightness which elicits low back pain  FUNCTIONAL TESTS:  5 times sit to stand: 14s arms crossed  GAIT: Distance walked: 28ft x2 Assistive device  utilized: None Level of assistance: Complete Independence Comments: unremarkable   TODAY'S TREATMENT:                                                                                                                              DATE: 07/04/22 Eval and HEP    PATIENT EDUCATION:  Education details: Discussed eval findings, rehab rationale and POC and patient is in agreement  Person educated: Patient and Child(ren) Education method: Explanation Education comprehension: verbalized understanding and needs further education  HOME EXERCISE PROGRAM: Access Code: [email or web address] URL: [email or web address] Date: 07/04/2022 Prepared by: Gustavus Bryant  Exercises - Curl Up with Arms Crossed  - 2 x daily - 5 x weekly - 1 sets - 10 reps - Supine 90/90 Abdominal Bracing  - 2 x daily - 5 x weekly - 2 sets - 2 reps - 30s hold - Supine March  - 2 x daily - 5 x weekly - 1 sets - 10 reps  ASSESSMENT:  CLINICAL IMPRESSION: Patient is a 71 y.o. female who was seen today for physical therapy evaluation and treatment for low back pain due to underlying spondylolisthesis. Patient presents with ROM limitations and pain with lumbar extension, core strength deficits, no neuro tension signs elicited, good hamstring flexibility and pain with L FABERs to to hip capsule tightness. She would benefit from OPPT to improve pain, core strength and L hip capsule mobility.   OBJECTIVE IMPAIRMENTS: decreased activity tolerance, difficulty walking, decreased ROM, and  pain.   ACTIVITY LIMITATIONS: carrying, lifting, bending, sitting, and squatting  PERSONAL FACTORS: Age, Past/current experiences, and Time since onset of injury/illness/exacerbation are also affecting patient's functional outcome.   REHAB POTENTIAL: Fair based on extent of degenerative changes  CLINICAL DECISION MAKING: Stable/uncomplicated  EVALUATION COMPLEXITY: Low   GOALS: Goals reviewed with patient? Yes  SHORT TERM GOALS: Target date:  07/18/2022  Patient to demonstrate independence in HEP  Baseline:[id number] Goal status: INITIAL   LONG TERM GOALS: Target date: 08/01/2022  Decrease worst pain to 4/10 Baseline: 7/10 Goal status: INITIAL  2.  Increase core strength to 3+/5 Baseline: 3/5 Goal status: INITIAL  3.  2/10 pain with FABER on L Baseline: 4/10 pain  Goal status: INITIAL    PLAN:  PT FREQUENCY: 2x/week  PT DURATION: 4 weeks  PLANNED INTERVENTIONS: Therapeutic exercises, Therapeutic activity, Neuromuscular re-education, Balance training, Gait training, Patient/Family education, Self Care, Joint mobilization, Stair training, DME instructions, Manual therapy, and Re-evaluation.  PLAN FOR NEXT SESSION: HEP review and update, core and abdominal strengthening, aerobic training, posture and BM education   Hildred Laser, PT 07/04/2022, 12:01 PM

## 2022-07-04 NOTE — Telephone Encounter (Signed)
Left message on f/u call 

## 2022-07-06 ENCOUNTER — Ambulatory Visit: Payer: Commercial Managed Care - HMO | Admitting: Gastroenterology

## 2022-07-07 ENCOUNTER — Encounter: Payer: Self-pay | Admitting: Gastroenterology

## 2022-07-07 ENCOUNTER — Ambulatory Visit: Payer: Commercial Managed Care - HMO

## 2022-07-07 DIAGNOSIS — M4316 Spondylolisthesis, lumbar region: Secondary | ICD-10-CM | POA: Diagnosis not present

## 2022-07-07 DIAGNOSIS — M6281 Muscle weakness (generalized): Secondary | ICD-10-CM

## 2022-07-07 DIAGNOSIS — M5459 Other low back pain: Secondary | ICD-10-CM

## 2022-07-07 NOTE — Therapy (Signed)
OUTPATIENT PHYSICAL THERAPY TREATMENT NOTE   Patient Name: Cindy Rivera MRN: [id number] DOB:December 11, 1950, 71 y.o., female Today's Date: 07/07/2022  PCP: Marcine Matar, MD  REFERRING PROVIDER: Kirby Funk, PA    END OF SESSION:   PT End of Session - 07/07/22 1302     Visit Number 2    Number of Visits 8    Date for PT Re-Evaluation 08/29/22    Authorization Type [employer]    PT Start Time 1301    PT Stop Time 1340    PT Time Calculation (min) 39 min    Activity Tolerance Patient tolerated treatment well    Behavior During Therapy [facility] for tasks assessed/performed             Past Medical History:  Diagnosis Date   Back pain    Fatty liver disease, nonalcoholic    Hypothyroidism    Past Surgical History:  Procedure Laterality Date   CATARACT EXTRACTION     Patient Active Problem List   Diagnosis Date Noted   Pure hypercholesterolemia 02/17/2022   Osteoporosis 01/26/2022   Hepatic steatosis 01/13/2022   Aortic atherosclerosis (HCC) 01/13/2022   Overweight (BMI 25.0-29.9) 01/13/2022   History of uterine fibroid 01/13/2022   Hypothyroidism (acquired) 01/13/2022   Bilateral lumbar radiculopathy 12/04/2021   Chronic cough 12/04/2021   Chronic sore throat 12/04/2021   Gastroesophageal reflux disease without esophagitis 12/04/2021   CAD in native artery 12/04/2021   Abnormal TSH 12/04/2021    REFERRING DIAG: M43.16 (ICD-10-CM) - Spondylolisthesis, lumbar region   THERAPY DIAG:  Spondylolisthesis of lumbar region  Other low back pain  Muscle weakness (generalized)  Rationale for Evaluation and Treatment Rehabilitation  PERTINENT HISTORY: None available  PRECAUTIONS: Back  SUBJECTIVE:                                                                                                                                                                                     Utilized [employer] interpreter throughout session. SUBJECTIVE STATEMENT:  Patient reports less  pain in back today.   PAIN:  Are you having pain? Yes: NPRS scale: 4/10 Pain location: low back and RLE Pain description: ache and  Aggravating factors: straining with bowel movement, walking,  Relieving factors: undetermined, advil   OBJECTIVE: (objective measures completed at initial evaluation unless otherwise dated)   DIAGNOSTIC FINDINGS:  Severe degenerative changes of the lower lumbar spine with anterolisthesis  of L4 and L5 measuring approximately 8 mm and severe bilateral facet  arthropathy.   Note that there is transitional anatomy, with vertebral level numbering as  above. Recommend careful anatomic correlation prior to any spinal  intervention.  PATIENT SURVEYS:  UTA due to language barrier   SCREENING FOR RED FLAGS: negative   COGNITION: Overall cognitive status: Within functional limits for tasks assessed                          SENSATION: Not tested   MUSCLE LENGTH: Hamstrings: Right 90 deg; Left 90 deg   POSTURE: No Significant postural limitations   PALPATION: Not tested   LUMBAR ROM:    AROM eval  Flexion 90%  Extension 75%  Right lateral flexion    Left lateral flexion    Right rotation    Left rotation     (Blank rows = not tested)   LOWER EXTREMITY ROM:   WNL B    Active  Right eval Left eval  Hip flexion      Hip extension      Hip abduction      Hip adduction      Hip internal rotation      Hip external rotation      Knee flexion      Knee extension      Ankle dorsiflexion      Ankle plantarflexion      Ankle inversion      Ankle eversion       (Blank rows = not tested)   LOWER EXTREMITY MMT:     MMT Right eval Left eval  Hip flexion      Hip extension      Hip abduction      Hip adduction      Hip internal rotation      Hip external rotation      Knee flexion      Knee extension      Ankle dorsiflexion      Ankle plantarflexion      Ankle inversion      Ankle eversion      core 3 3   (Blank rows =  not tested)   LUMBAR SPECIAL TESTS:  Prone instability test: Positive, Straight leg raise test: Negative, Slump test: Negative, and FABER test: Positive L hip capsule tightness which elicits low back pain   FUNCTIONAL TESTS:  5 times sit to stand: 14s arms crossed   GAIT: Distance walked: 40ft x2 Assistive device utilized: None Level of assistance: Complete Independence Comments: unremarkable    TODAY'S TREATMENT:                       [facility] Adult PT Treatment:                                                DATE: 07/07/2022 Therapeutic Exercise: Nustep level 5 x 7 mins  Standing hip abduction RTB at ankles 2x10 BIL Bridges 2x10 (hamstring cramp on Rt at end) Supine clamshell GTB 2x10 Supine marching GTB 2x10 BIL DKTC 2x30" SKTC 2x30" BIL Supine 90/90 isometric hold 3x20" Supine figure 4 piriformis stretch 2x30" BIL Modified thomas stretch (too painful, cramps)  DATE: 07/04/22 Eval and HEP      PATIENT EDUCATION:  Education details: Discussed eval findings, rehab rationale and POC and patient is in agreement  Person educated: Patient and Child(ren) Education method: Explanation Education comprehension: verbalized understanding and needs further education   HOME EXERCISE PROGRAM: Access Code: [email or web address] URL: [email or web address] Date: 07/04/2022 Prepared by: Gustavus Bryant   Exercises - Curl Up with Arms Crossed  - 2 x daily - 5 x weekly - 1 sets - 10 reps - Supine 90/90 Abdominal Bracing  - 2 x daily - 5 x weekly - 2 sets - 2 reps - 30s hold - Supine March  - 2 x daily - 5 x weekly - 1 sets - 10 reps   ASSESSMENT:   CLINICAL IMPRESSION: Patient presents to PT with decreased overall pain in her back and reports HEP compliance. Session today focused on proximal hip and core strengthening as well as stretching for BIL hips. She had a mild muscle cramp in  her Rt hamstring at the end of performing bridges which dissipated with rest. Attempted modified thomas stretch, but this elicited cramps in BIL LE, terminated exercise early. Patient was able to tolerate all prescribed exercises with no adverse effects. Patient continues to benefit from skilled PT services and should be progressed as able to improve functional independence.   OBJECTIVE IMPAIRMENTS: decreased activity tolerance, difficulty walking, decreased ROM, and pain.    ACTIVITY LIMITATIONS: carrying, lifting, bending, sitting, and squatting   PERSONAL FACTORS: Age, Past/current experiences, and Time since onset of injury/illness/exacerbation are also affecting patient's functional outcome.    REHAB POTENTIAL: Fair based on extent of degenerative changes   CLINICAL DECISION MAKING: Stable/uncomplicated   EVALUATION COMPLEXITY: Low     GOALS: Goals reviewed with patient? Yes   SHORT TERM GOALS: Target date: 07/18/2022   Patient to demonstrate independence in HEP  Baseline:[email or web address] Goal status: INITIAL     LONG TERM GOALS: Target date: 08/01/2022   Decrease worst pain to 4/10 Baseline: 7/10 Goal status: INITIAL   2.  Increase core strength to 3+/5 Baseline: 3/5 Goal status: INITIAL   3.  2/10 pain with FABER on L Baseline: 4/10 pain  Goal status: INITIAL       PLAN:   PT FREQUENCY: 2x/week   PT DURATION: 4 weeks   PLANNED INTERVENTIONS: Therapeutic exercises, Therapeutic activity, Neuromuscular re-education, Balance training, Gait training, Patient/Family education, Self Care, Joint mobilization, Stair training, DME instructions, Manual therapy, and Re-evaluation.   PLAN FOR NEXT SESSION: HEP review and update, core and abdominal strengthening, aerobic training, posture and BM education   Berta Minor, PTA 07/07/2022, 1:41 PM

## 2022-07-11 ENCOUNTER — Ambulatory Visit: Payer: Commercial Managed Care - HMO

## 2022-07-12 ENCOUNTER — Ambulatory Visit (INDEPENDENT_AMBULATORY_CARE_PROVIDER_SITE_OTHER): Payer: Commercial Managed Care - HMO | Admitting: Pulmonary Disease

## 2022-07-12 ENCOUNTER — Encounter: Payer: Self-pay | Admitting: Pulmonary Disease

## 2022-07-12 ENCOUNTER — Ambulatory Visit: Payer: Commercial Managed Care - HMO | Admitting: Pulmonary Disease

## 2022-07-12 VITALS — BP 108/58 | HR 67 | Temp 98.4°F | Ht 60.0 in | Wt 149.8 lb

## 2022-07-12 DIAGNOSIS — R053 Chronic cough: Secondary | ICD-10-CM

## 2022-07-12 DIAGNOSIS — G4733 Obstructive sleep apnea (adult) (pediatric): Secondary | ICD-10-CM | POA: Diagnosis not present

## 2022-07-12 LAB — PULMONARY FUNCTION TEST
DL/VA % pred: 95 %
DL/VA: 4.09 ml/min/mmHg/L
DLCO cor % pred: 104 %
DLCO cor: 17.59 ml/min/mmHg
DLCO unc % pred: 99 %
DLCO unc: 16.78 ml/min/mmHg
FEF 25-75 Post: 1.68 L/sec
FEF 25-75 Pre: 1.19 L/sec
FEF2575-%Change-Post: 41 %
FEF2575-%Pred-Post: 102 %
FEF2575-%Pred-Pre: 72 %
FEV1-%Change-Post: 13 %
FEV1-%Pred-Post: 100 %
FEV1-%Pred-Pre: 88 %
FEV1-Post: 1.87 L
FEV1-Pre: 1.65 L
FEV1FVC-%Change-Post: 5 %
FEV1FVC-%Pred-Pre: 91 %
FEV6-%Change-Post: 3 %
FEV6-%Pred-Post: 104 %
FEV6-%Pred-Pre: 100 %
FEV6-Post: 2.47 L
FEV6-Pre: 2.38 L
FEV6FVC-%Change-Post: 0 %
FEV6FVC-%Pred-Post: 105 %
FEV6FVC-%Pred-Pre: 104 %
FVC-%Change-Post: 7 %
FVC-%Pred-Post: 102 %
FVC-%Pred-Pre: 96 %
FVC-Post: 2.56 L
FVC-Pre: 2.39 L
Post FEV1/FVC ratio: 73 %
Post FEV6/FVC ratio: 100 %
Pre FEV1/FVC ratio: 69 %
Pre FEV6/FVC Ratio: 100 %
RV % pred: 103 %
RV: 2.07 L
TLC % pred: 119 %
TLC: 5.32 L

## 2022-07-12 NOTE — Progress Notes (Signed)
Full PFT Performed Today  

## 2022-07-12 NOTE — Progress Notes (Signed)
Cindy Rivera    [id number]    01/15/1951  Primary Care Physician:Johnson, Binnie Rail, MD  Referring Physician: Marcine Matar, MD [address] [place],  [place] [address]  Chief complaint:   Chronic cough for years Shortness of breath on exertion Snoring and choking respirations at night  Patient accompanied by daughter who was able to interpret for her  HPI:  Snoring and choking respirations at night for a long time Wakes up with a dry mouth Usually tries to go to bed about midnight wakes up at 6 AM Sleep is nonrestorative She does sometimes have insomnia  She does have reflux for which she was just placed on omeprazole  Exposure to secondhand smoke from her spouse  Breathing has been worse in the last year She does have cough, shortness of breath, occasional wheezes  She takes her time to do things, she does get more short of breath walking uphill or when she is walking fast or when the weather is colder  She never smoked cigarettes She worked in [employer] and 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   Multiple Vitamins-Minerals (MULTI FOR HER) TABS Take 1 tablet by mouth as needed.   omeprazole (PRILOSEC) 40 MG capsule Take 1 capsule (40 mg total) by mouth 2 (two) times daily. (Patient taking differently: Take 40 mg by mouth 2 (two) times daily. Use twice a day for 8 weeks then reduce to 40 mg daily.)   Oyster Shell Calcium 500 MG TABS Take 1 tablet by mouth daily.   zoledronic acid (RECLAST) 5 MG/100ML SOLN injection Inject 5 mg into the vein once.   loratadine  (CLARITIN) 10 MG tablet Take 1 tablet (10 mg total) by mouth daily. (Patient not taking: Reported on 07/12/2022)   No facility-administered encounter medications on file as of 07/12/2022.    Allergies as of 07/12/2022 - Review Complete 07/12/2022  Allergen Reaction Noted   Latex Swelling 05/18/2022   Berberine  10/09/2021   Crestor [rosuvastatin] Swelling 02/17/2022   Lipitor [atorvastatin] Swelling 02/17/2022   Repatha [evolocumab] Swelling 06/14/2022    Past Medical History:  Diagnosis Date   Back pain    Fatty liver disease, nonalcoholic    Hypothyroidism     Past Surgical History:  Procedure Laterality Date   CATARACT EXTRACTION      Family History  Problem Relation Age of Onset   Colon cancer Neg Hx    Stomach cancer Neg Hx    Esophageal cancer Neg Hx    Colon polyps Neg Hx     Social History   Socioeconomic History   Marital status: Single    Spouse name: Not on file   Number of children: 1   Years of education: Not on file   Highest education level: Not on file  Occupational History   Occupation: Retired  Tobacco Use   Smoking status: Never   Smokeless tobacco: Never  Vaping Use   Vaping Use: Never used  Substance and Sexual Activity   Alcohol use: Never   Drug  use: Never   Sexual activity: Not on file  Other Topics Concern   Not on file  Social History Narrative   Not on file   Social Determinants of Health   Financial Resource Strain: Not on file  Food Insecurity: Not on file  Transportation Needs: Not on file  Physical Activity: Not on file  Stress: Not on file  Social Connections: Not on file  Intimate Partner Violence: Not on file    Review of Systems  Respiratory:  Positive for cough and shortness of breath.   Psychiatric/Behavioral:  Positive for sleep disturbance.     Vitals:   07/12/22 1310  BP: (!) 108/58  Pulse: 67  Temp: 98.4 F (36.9 C)  SpO2: 96%     Physical Exam Constitutional:      Appearance: Normal  appearance.  HENT:     Head: Normocephalic.     Mouth/Throat:     Mouth: Mucous membranes are moist.  Cardiovascular:     Rate and Rhythm: Normal rate and regular rhythm.     Heart sounds: No murmur heard. Pulmonary:     Effort: No respiratory distress.     Breath sounds: No stridor. No wheezing or rhonchi.  Musculoskeletal:     Cervical back: No rigidity or tenderness.  Neurological:     Mental Status: She is alert.  Psychiatric:        Mood and Affect: Mood normal.      07/12/2022    1:00 PM  Results of the Epworth flowsheet  Sitting and reading 1  Watching TV 1  Sitting, inactive in a public place (e.g. a theatre or a meeting) 0  As a passenger in a car for an hour without a break 2  Lying down to rest in the afternoon when circumstances permit 0  Sitting and talking to someone 0  Sitting quietly after a lunch without alcohol 0  In a car, while stopped for a few minutes in traffic 0  Total score 4     Data Reviewed: Cardiac CT reviewed-bibasal atelectasis, some groundglass changes  Echocardiogram 12/31/2021 with normal ejection fraction with normal right-sided pressures  Assessment:  Concern for sleep disordered breathing -Choking respirations -Noted to have apneas during recent sedation -Nonrestorative sleep -Insomnia  Shortness of breath/cough -Atelectasis on cardiac CT with groundglass changes at the bases -Concern for interstitial lung disease  Plan/Recommendations: Obtain high-resolution CT of the chest  Obtain a sleep study  Schedule for pulmonary function test  Follow-up in 4 weeks  Virl Diamond MD [facility] 07/12/2022, 1:43 PM  CC: Marcine Matar, MD

## 2022-07-12 NOTE — Patient Instructions (Signed)
I will see you back in about 4 weeks  High-resolution CT-this is for haziness in the lungs that we saw on the heart study  For snoring and choking respirations-we will order a sleep study  For the chronic cough and shortness of breath-we will order a pulmonary function test  Call us with significant concerns

## 2022-07-12 NOTE — Patient Instructions (Signed)
Full PFT Performed Today  

## 2022-07-13 ENCOUNTER — Telehealth: Payer: Self-pay | Admitting: Pulmonary Disease

## 2022-07-13 ENCOUNTER — Encounter: Payer: Self-pay | Admitting: Pulmonary Disease

## 2022-07-13 MED ORDER — FLUTICASONE FUROATE-VILANTEROL 100-25 MCG/ACT IN AEPB: 1.0000 | INHALATION_SPRAY | Freq: Every day | RESPIRATORY_TRACT | 3 refills | Status: DC

## 2022-07-13 NOTE — Telephone Encounter (Signed)
PFT showing obstruction will be consistent with-obstructive lung disease  -Yes, she may have COPD based on exposure in the past -You may see similar patterns in somebody with just bronchitis -May be seen in asthma because of the significant response to albuterol that was given during the testing  -Management is always with inhalers so yes, she may need to use Breo in the foreseeable future  -Medication side effects unfortunately are difficult to predict, it does not mean you will have any of them

## 2022-07-13 NOTE — Telephone Encounter (Signed)
Daughter sent mychart message in regards to PFT results from Dr Val Eagle. I sent message back to patient and her daughter with the results from Dr Val Eagle. Nothing further needed since its a mychart message.

## 2022-07-13 NOTE — Telephone Encounter (Addendum)
Attempted to call pt's daughter Leanor Kail but unable to reach and unable to leave VM as no machine ever kicked in. Have sent a mychart message to pt for her and daughter to see.

## 2022-07-13 NOTE — Telephone Encounter (Signed)
Breathing study was reviewed which showed significant obstruction with significant bronchodilator response  Patient will benefit from being on inhalers for her shortness of breath  Prescription for Breo to be used once daily sent to pharmacy for her

## 2022-07-18 ENCOUNTER — Ambulatory Visit: Payer: Commercial Managed Care - HMO

## 2022-07-18 DIAGNOSIS — M4316 Spondylolisthesis, lumbar region: Secondary | ICD-10-CM | POA: Diagnosis not present

## 2022-07-18 DIAGNOSIS — M6281 Muscle weakness (generalized): Secondary | ICD-10-CM

## 2022-07-18 DIAGNOSIS — M5459 Other low back pain: Secondary | ICD-10-CM

## 2022-07-18 NOTE — Therapy (Signed)
OUTPATIENT PHYSICAL THERAPY TREATMENT NOTE   Patient Name: Cindy Rivera MRN: [id number] DOB:1951-04-01, 71 y.o., female Today's Date: 07/18/2022  PCP: Marcine Matar, MD  REFERRING PROVIDER: Kirby Funk, PA    END OF SESSION:   PT End of Session - 07/18/22 1405     Visit Number 3    Number of Visits 8    Date for PT Re-Evaluation 08/29/22    Authorization Type [employer]    PT Start Time 1400    PT Stop Time 1440    PT Time Calculation (min) 40 min    Activity Tolerance Patient tolerated treatment well    Behavior During Therapy [facility] for tasks assessed/performed             Past Medical History:  Diagnosis Date   Back pain    Fatty liver disease, nonalcoholic    Hypothyroidism    Past Surgical History:  Procedure Laterality Date   CATARACT EXTRACTION     Patient Active Problem List   Diagnosis Date Noted   Pure hypercholesterolemia 02/17/2022   Osteoporosis 01/26/2022   Hepatic steatosis 01/13/2022   Aortic atherosclerosis (HCC) 01/13/2022   Overweight (BMI 25.0-29.9) 01/13/2022   History of uterine fibroid 01/13/2022   Hypothyroidism (acquired) 01/13/2022   Bilateral lumbar radiculopathy 12/04/2021   Chronic cough 12/04/2021   Chronic sore throat 12/04/2021   Gastroesophageal reflux disease without esophagitis 12/04/2021   CAD in native artery 12/04/2021   Abnormal TSH 12/04/2021    REFERRING DIAG: M43.16 (ICD-10-CM) - Spondylolisthesis, lumbar region   THERAPY DIAG:  Spondylolisthesis of lumbar region  Other low back pain  Muscle weakness (generalized)  Rationale for Evaluation and Treatment Rehabilitation  PERTINENT HISTORY: None available  PRECAUTIONS: Back  SUBJECTIVE:                                                                                                                                                                                     Utilized AMN interpreter throughout session. SUBJECTIVE STATEMENT:  Pain and symptoms  have now shifted to LLE however low back symptoms have decreased in intensity.   PAIN:  Are you having pain? Yes: NPRS scale: 4/10 Pain location: low back and RLE Pain description: ache and  Aggravating factors: straining with bowel movement, walking,  Relieving factors: undetermined, advil   OBJECTIVE: (objective measures completed at initial evaluation unless otherwise dated)   DIAGNOSTIC FINDINGS:  Severe degenerative changes of the lower lumbar spine with anterolisthesis  of L4 and L5 measuring approximately 8 mm and severe bilateral facet  arthropathy.   Note that there is transitional anatomy, with vertebral level numbering as  above. Recommend careful anatomic correlation  prior to any spinal  intervention.      PATIENT SURVEYS:  UTA due to language barrier   SCREENING FOR RED FLAGS: negative   COGNITION: Overall cognitive status: Within functional limits for tasks assessed                          SENSATION: Not tested   MUSCLE LENGTH: Hamstrings: Right 90 deg; Left 90 deg   POSTURE: No Significant postural limitations   PALPATION: Not tested   LUMBAR ROM:    AROM eval  Flexion 90%  Extension 75%  Right lateral flexion    Left lateral flexion    Right rotation    Left rotation     (Blank rows = not tested)   LOWER EXTREMITY ROM:   WNL B    Active  Right eval Left eval  Hip flexion      Hip extension      Hip abduction      Hip adduction      Hip internal rotation      Hip external rotation      Knee flexion      Knee extension      Ankle dorsiflexion      Ankle plantarflexion      Ankle inversion      Ankle eversion       (Blank rows = not tested)   LOWER EXTREMITY MMT:     MMT Right eval Left eval  Hip flexion      Hip extension      Hip abduction      Hip adduction      Hip internal rotation      Hip external rotation      Knee flexion      Knee extension      Ankle dorsiflexion      Ankle plantarflexion      Ankle  inversion      Ankle eversion      core 3 3   (Blank rows = not tested)   LUMBAR SPECIAL TESTS:  Prone instability test: Positive, Straight leg raise test: Negative, Slump test: Negative, and FABER test: Positive L hip capsule tightness which elicits low back pain   FUNCTIONAL TESTS:  5 times sit to stand: 14s arms crossed   GAIT: Distance walked: 52ft x2 Assistive device utilized: None Level of assistance: Complete Independence Comments: unremarkable    TODAY'S TREATMENT:         [facility] Adult PT Treatment:                                                DATE: 07/18/22 Therapeutic Exercise: Nustep level 4 x 8 mins  B hamstring stretch in sitting 30s x2 PPT 3s 10x Supine clamshell GTB 15x2 Supine marching w/PPT 15/15 DKTC w/ball 15x Supine 90/90 isometric hold 30s x2 Prone prop 2 min duration             [facility] Adult PT Treatment:                                                DATE: 07/07/2022 Therapeutic Exercise: Nustep level 5 x 7 mins  Standing  hip abduction RTB at ankles 2x10 BIL Bridges 2x10 (hamstring cramp on Rt at end) Supine clamshell GTB 2x10 Supine marching GTB 2x10 BIL DKTC 2x30" SKTC 2x30" BIL Supine 90/90 isometric hold 3x20" Supine figure 4 piriformis stretch 2x30" BIL Modified thomas stretch (too painful, cramps)                                                                                                          DATE: 07/04/22 Eval and HEP      PATIENT EDUCATION:  Education details: Discussed eval findings, rehab rationale and POC and patient is in agreement  Person educated: Patient and Child(ren) Education method: Explanation Education comprehension: verbalized understanding and needs further education   HOME EXERCISE PROGRAM: Access Code: [email or web address] URL: [email or web address] Date: 07/04/2022 Prepared by: Gustavus Bryant   Exercises - Curl Up with Arms Crossed  - 2 x daily - 5 x weekly - 1 sets - 10 reps - Supine 90/90  Abdominal Bracing  - 2 x daily - 5 x weekly - 2 sets - 2 reps - 30s hold - Supine March  - 2 x daily - 5 x weekly - 1 sets - 10 reps   ASSESSMENT:   CLINICAL IMPRESSION: Symptoms less intense but not resolved.  Symptoms have migrated to opposite leg but remain unchanged.  Added prone prop to facilitate hip flexor stretch but not encourage any extension in lumbar spine.  Incorporated additional abdominal and core work for spinal stabilization.  OBJECTIVE IMPAIRMENTS: decreased activity tolerance, difficulty walking, decreased ROM, and pain.    ACTIVITY LIMITATIONS: carrying, lifting, bending, sitting, and squatting   PERSONAL FACTORS: Age, Past/current experiences, and Time since onset of injury/illness/exacerbation are also affecting patient's functional outcome.    REHAB POTENTIAL: Fair based on extent of degenerative changes   CLINICAL DECISION MAKING: Stable/uncomplicated   EVALUATION COMPLEXITY: Low     GOALS: Goals reviewed with patient? Yes   SHORT TERM GOALS: Target date: 07/18/2022   Patient to demonstrate independence in HEP  Baseline:[id number] Goal status: INITIAL     LONG TERM GOALS: Target date: 08/01/2022   Decrease worst pain to 4/10 Baseline: 7/10 Goal status: INITIAL   2.  Increase core strength to 3+/5 Baseline: 3/5 Goal status: INITIAL   3.  2/10 pain with FABER on L Baseline: 4/10 pain  Goal status: INITIAL       PLAN:   PT FREQUENCY: 2x/week   PT DURATION: 4 weeks   PLANNED INTERVENTIONS: Therapeutic exercises, Therapeutic activity, Neuromuscular re-education, Balance training, Gait training, Patient/Family education, Self Care, Joint mobilization, Stair training, DME instructions, Manual therapy, and Re-evaluation.   PLAN FOR NEXT SESSION: HEP review and update, core and abdominal strengthening, aerobic training, posture and BM education   Hildred Laser, PT 07/18/2022, 2:43 PM

## 2022-07-18 NOTE — Telephone Encounter (Signed)
She may choose not to use inhalers -The inhalers help her shortness of breath  Inhalers are designed to be helpful over 12 to 24 hours depending on dosing  -No withdrawal effects

## 2022-07-20 ENCOUNTER — Ambulatory Visit: Payer: Commercial Managed Care - HMO

## 2022-07-20 DIAGNOSIS — M5459 Other low back pain: Secondary | ICD-10-CM

## 2022-07-20 DIAGNOSIS — M4316 Spondylolisthesis, lumbar region: Secondary | ICD-10-CM

## 2022-07-20 DIAGNOSIS — M6281 Muscle weakness (generalized): Secondary | ICD-10-CM

## 2022-07-20 NOTE — Therapy (Signed)
OUTPATIENT PHYSICAL THERAPY TREATMENT NOTE   Patient Name: Cindy Rivera MRN: [id number] DOB:01-Sep-1950, 71 y.o., female Today's Date: 07/20/2022  PCP: Marcine Matar, MD  REFERRING PROVIDER: Kirby Funk, PA    END OF SESSION:   PT End of Session - 07/20/22 1047     Visit Number 4    Number of Visits 8    Date for PT Re-Evaluation 08/29/22    Authorization Type [employer]    PT Start Time 1045    PT Stop Time 1125    PT Time Calculation (min) 40 min    Activity Tolerance Patient tolerated treatment well    Behavior During Therapy [facility] for tasks assessed/performed             Past Medical History:  Diagnosis Date   Back pain    Fatty liver disease, nonalcoholic    Hypothyroidism    Past Surgical History:  Procedure Laterality Date   CATARACT EXTRACTION     Patient Active Problem List   Diagnosis Date Noted   Pure hypercholesterolemia 02/17/2022   Osteoporosis 01/26/2022   Hepatic steatosis 01/13/2022   Aortic atherosclerosis (HCC) 01/13/2022   Overweight (BMI 25.0-29.9) 01/13/2022   History of uterine fibroid 01/13/2022   Hypothyroidism (acquired) 01/13/2022   Bilateral lumbar radiculopathy 12/04/2021   Chronic cough 12/04/2021   Chronic sore throat 12/04/2021   Gastroesophageal reflux disease without esophagitis 12/04/2021   CAD in native artery 12/04/2021   Abnormal TSH 12/04/2021    REFERRING DIAG: M43.16 (ICD-10-CM) - Spondylolisthesis, lumbar region   THERAPY DIAG:  Spondylolisthesis of lumbar region  Other low back pain  Muscle weakness (generalized)  Rationale for Evaluation and Treatment Rehabilitation  PERTINENT HISTORY: None available  PRECAUTIONS: Back  SUBJECTIVE:         Utilized AMN interpreter throughout session.                                                                                                                                                                             SUBJECTIVE STATEMENT:  Reports symptoms have  improved.  Now able to tolerate heavier lifting tasks and more strenuous activities.  Feels she may be able to continue on her own with a HEP.   PAIN:  Are you having pain? Yes: NPRS scale: 4/10 Pain location: low back and RLE Pain description: ache and  Aggravating factors: straining with bowel movement, walking,  Relieving factors: undetermined, advil   OBJECTIVE: (objective measures completed at initial evaluation unless otherwise dated)   DIAGNOSTIC FINDINGS:  Severe degenerative changes of the lower lumbar spine with anterolisthesis  of L4 and L5 measuring approximately 8 mm and severe bilateral facet  arthropathy.   Note that there  is transitional anatomy, with vertebral level numbering as  above. Recommend careful anatomic correlation prior to any spinal  intervention.      PATIENT SURVEYS:  UTA due to language barrier   SCREENING FOR RED FLAGS: negative   COGNITION: Overall cognitive status: Within functional limits for tasks assessed                          SENSATION: Not tested   MUSCLE LENGTH: Hamstrings: Right 90 deg; Left 90 deg   POSTURE: No Significant postural limitations   PALPATION: Not tested   LUMBAR ROM:    AROM eval  Flexion 90%  Extension 75%  Right lateral flexion    Left lateral flexion    Right rotation    Left rotation     (Blank rows = not tested)   LOWER EXTREMITY ROM:   WNL B    Active  Right eval Left eval  Hip flexion      Hip extension      Hip abduction      Hip adduction      Hip internal rotation      Hip external rotation      Knee flexion      Knee extension      Ankle dorsiflexion      Ankle plantarflexion      Ankle inversion      Ankle eversion       (Blank rows = not tested)   LOWER EXTREMITY MMT:     MMT Right eval Left eval  Hip flexion      Hip extension      Hip abduction      Hip adduction      Hip internal rotation      Hip external rotation      Knee flexion      Knee extension       Ankle dorsiflexion      Ankle plantarflexion      Ankle inversion      Ankle eversion      core 3 3   (Blank rows = not tested)   LUMBAR SPECIAL TESTS:  Prone instability test: Positive, Straight leg raise test: Negative, Slump test: Negative, and FABER test: Positive L hip capsule tightness which elicits low back pain   FUNCTIONAL TESTS:  5 times sit to stand: 14s arms crossed   GAIT: Distance walked: 5375ft x2 Assistive device utilized: None Level of assistance: Complete Independence Comments: unremarkable    TODAY'S TREATMENT:        [facility] Adult PT Treatment:                                                DATE: 07/20/22 Therapeutic Exercise: Nustep level 4 x 8 mins  Curl ups 15x B hamstring stretch in sitting 30s x2 PPT 3s 15x Supine clamshell GTB 15x2 Supine marching w/PPT 30s x2 DKTC w/ball 15x Supine 90/90 isometric hold 30s x2 Prone prop 2 min duration  [facility] Adult PT Treatment:                                                DATE: 07/18/22 Therapeutic Exercise: Nustep level  4 x 8 mins  B hamstring stretch in sitting 30s x2 PPT 3s 10x Supine clamshell GTB 15x2 Supine marching w/PPT 15/15 DKTC w/ball 15x Supine 90/90 isometric hold 30s x2 Prone prop 2 min duration             [facility] Adult PT Treatment:                                                DATE: 07/07/2022 Therapeutic Exercise: Nustep level 5 x 7 mins  Standing hip abduction RTB at ankles 2x10 BIL Bridges 2x10 (hamstring cramp on Rt at end) Supine clamshell GTB 2x10 Supine marching GTB 2x10 BIL DKTC 2x30" SKTC 2x30" BIL Supine 90/90 isometric hold 3x20" Supine figure 4 piriformis stretch 2x30" BIL Modified thomas stretch (too painful, cramps)                                                                                                          DATE: 07/04/22 Eval and HEP      PATIENT EDUCATION:  Education details: Discussed eval findings, rehab rationale and POC and patient is in agreement   Person educated: Patient and Child(ren) Education method: Explanation Education comprehension: verbalized understanding and needs further education   HOME EXERCISE PROGRAM: Access Code: [email or web address] URL: [email or web address] Date: 07/20/2022 Prepared by: Gustavus Bryant  Exercises - Curl Up with Arms Crossed  - 2 x daily - 5 x weekly - 1 sets - 15 reps - Supine 90/90 Abdominal Bracing  - 2 x daily - 5 x weekly - 2 sets - 2 reps - 30s hold - Sit to Stand with Arms Crossed  - 2 x daily - 5 x weekly - 1 sets - 10 reps - Supine 90/90 Alternating Toe Touch  - 2 x daily - 5 x weekly - 1 sets - 2 reps - 30s hold - Supine Posterior Pelvic Tilt  - 2 x daily - 5 x weekly - 2 sets - 10 reps - 30s hold   ASSESSMENT:   CLINICAL IMPRESSION: Symptoms less overall and feels she may be ready for DC to HEP, spoke to dtr who feels she should continue POC.  Reviewed and added to HEP incorporating additional abdominal and core tasks.  New HEP issued to patient who was able to demo exercises to PT properly. Patient will speak with daughter regarding continued PT frequency.  OBJECTIVE IMPAIRMENTS: decreased activity tolerance, difficulty walking, decreased ROM, and pain.    ACTIVITY LIMITATIONS: carrying, lifting, bending, sitting, and squatting   PERSONAL FACTORS: Age, Past/current experiences, and Time since onset of injury/illness/exacerbation are also affecting patient's functional outcome.    REHAB POTENTIAL: Fair based on extent of degenerative changes   CLINICAL DECISION MAKING: Stable/uncomplicated   EVALUATION COMPLEXITY: Low     GOALS: Goals reviewed with patient? Yes   SHORT TERM GOALS: Target date: 07/18/2022   Patient to demonstrate independence in HEP  Baseline:[id number] Goal status:  INITIAL     LONG TERM GOALS: Target date: 08/01/2022   Decrease worst pain to 4/10 Baseline: 7/10 Goal status: INITIAL   2.  Increase core strength to 3+/5 Baseline: 3/5 Goal  status: INITIAL   3.  2/10 pain with FABER on L Baseline: 4/10 pain  Goal status: INITIAL       PLAN:   PT FREQUENCY: 2x/week   PT DURATION: 4 weeks   PLANNED INTERVENTIONS: Therapeutic exercises, Therapeutic activity, Neuromuscular re-education, Balance training, Gait training, Patient/Family education, Self Care, Joint mobilization, Stair training, DME instructions, Manual therapy, and Re-evaluation.   PLAN FOR NEXT SESSION: HEP review and update, core and abdominal strengthening, aerobic training, posture and BM education   Hildred Laser, PT 07/20/2022, 11:31 AM

## 2022-07-22 ENCOUNTER — Ambulatory Visit (HOSPITAL_COMMUNITY): Payer: Commercial Managed Care - HMO

## 2022-07-25 ENCOUNTER — Ambulatory Visit: Payer: Commercial Managed Care - HMO | Attending: Internal Medicine

## 2022-07-25 DIAGNOSIS — M6281 Muscle weakness (generalized): Secondary | ICD-10-CM | POA: Insufficient documentation

## 2022-07-25 DIAGNOSIS — M5459 Other low back pain: Secondary | ICD-10-CM | POA: Diagnosis present

## 2022-07-25 DIAGNOSIS — M4316 Spondylolisthesis, lumbar region: Secondary | ICD-10-CM | POA: Insufficient documentation

## 2022-07-25 NOTE — Therapy (Signed)
OUTPATIENT PHYSICAL THERAPY TREATMENT NOTE   Patient Name: Cindy Rivera MRN: [id number] DOB:[birth year], 71 y.o., female Today's Date: 07/20/2022  PCP: Ladell Pier, MD  REFERRING PROVIDER: Mariam Dollar, PA    END OF SESSION:   PT End of Session - 07/20/22 1047     Visit Number 4    Number of Visits 8    Date for PT Re-Evaluation 08/29/22    Authorization Type [employer]    PT Start Time 1045    PT Stop Time 1125    PT Time Calculation (min) 40 min    Activity Tolerance Patient tolerated treatment well    Behavior During Therapy [facility] for tasks assessed/performed             Past Medical History:  Diagnosis Date   Back pain    Fatty liver disease, nonalcoholic    Hypothyroidism    Past Surgical History:  Procedure Laterality Date   CATARACT EXTRACTION     Patient Active Problem List   Diagnosis Date Noted   Pure hypercholesterolemia 02/17/2022   Osteoporosis 01/26/2022   Hepatic steatosis 01/13/2022   Aortic atherosclerosis ([place]) 01/13/2022   Overweight (BMI 25.0-29.9) 01/13/2022   History of uterine fibroid 01/13/2022   Hypothyroidism (acquired) 01/13/2022   Bilateral lumbar radiculopathy 12/04/2021   Chronic cough 12/04/2021   Chronic sore throat 12/04/2021   Gastroesophageal reflux disease without esophagitis 12/04/2021   CAD in native artery 12/04/2021   Abnormal TSH 12/04/2021    REFERRING DIAG: M43.16 (ICD-10-CM) - Spondylolisthesis, lumbar region   THERAPY DIAG:  Spondylolisthesis of lumbar region  Other low back pain  Muscle weakness (generalized)  Rationale for Evaluation and Treatment Rehabilitation  PERTINENT HISTORY: None available  PRECAUTIONS: Back  SUBJECTIVE:         Utilized AMN interpreter throughout session.                                                                                                                                                                             SUBJECTIVE STATEMENT:  Relates onset of neck  pain as well R knee pain but minimal low back pain    PAIN:  Are you having pain? Yes: NPRS scale: 4/10 Pain location: low back and RLE Pain description: ache and  Aggravating factors: straining with bowel movement, walking,  Relieving factors: undetermined, advil   OBJECTIVE: (objective measures completed at initial evaluation unless otherwise dated)   DIAGNOSTIC FINDINGS:  Severe degenerative changes of the lower lumbar spine with anterolisthesis  of L4 and L5 measuring approximately 8 mm and severe bilateral facet  arthropathy.   Note that there is transitional anatomy, with vertebral level numbering as  above. Recommend careful anatomic correlation  prior to any spinal  intervention.      PATIENT SURVEYS:  UTA due to language barrier   SCREENING FOR RED FLAGS: negative   COGNITION: Overall cognitive status: Within functional limits for tasks assessed                          SENSATION: Not tested   MUSCLE LENGTH: Hamstrings: Right 90 deg; Left 90 deg   POSTURE: No Significant postural limitations   PALPATION: Not tested   LUMBAR ROM:    AROM eval  Flexion 90%  Extension 75%  Right lateral flexion    Left lateral flexion    Right rotation    Left rotation     (Blank rows = not tested)   LOWER EXTREMITY ROM:   WNL B    Active  Right eval Left eval  Hip flexion      Hip extension      Hip abduction      Hip adduction      Hip internal rotation      Hip external rotation      Knee flexion      Knee extension      Ankle dorsiflexion      Ankle plantarflexion      Ankle inversion      Ankle eversion       (Blank rows = not tested)   LOWER EXTREMITY MMT:     MMT Right eval Left eval  Hip flexion      Hip extension      Hip abduction      Hip adduction      Hip internal rotation      Hip external rotation      Knee flexion      Knee extension      Ankle dorsiflexion      Ankle plantarflexion      Ankle inversion      Ankle eversion       core 3 3   (Blank rows = not tested)   LUMBAR SPECIAL TESTS:  Prone instability test: Positive, Straight leg raise test: Negative, Slump test: Negative, and FABER test: Positive L hip capsule tightness which elicits low back pain   FUNCTIONAL TESTS:  5 times sit to stand: 14s arms crossed   GAIT: Distance walked: 65f x2 Assistive device utilized: None Level of assistance: Complete Independence Comments: unremarkable    TODAY'S TREATMENT:        [facility] Adult PT Treatment:                                                DATE: 07/25/22 Therapeutic Exercise: Nustep level 5 x 8 mins  Curl ups 15x B hamstring stretch in sitting 30s x2 PPT 3s 15x Bridge 15x Bridge with ball 15x Supine clamshell GTB 15x Sidelie clams GTB 15/15 Supine bicycling w/PPT 30s x2 DKTC w/ball 15x Supine 90/90 isometric hold 30s x2 Prone prop 2 min duration  [facility] Adult PT Treatment:                                                DATE: 07/20/22 Therapeutic Exercise: Nustep level 4 x 8 mins  Curl ups 15x B hamstring stretch in sitting 30s x2 PPT 3s 15x Supine clamshell GTB 15x2 Supine marching w/PPT 30s x2 DKTC w/ball 15x Supine 90/90 isometric hold 30s x2 Prone prop 2 min duration  [facility] Adult PT Treatment:                                                DATE: 07/18/22 Therapeutic Exercise: Nustep level 4 x 8 mins  B hamstring stretch in sitting 30s x2 PPT 3s 10x Supine clamshell GTB 15x2 Supine marching w/PPT 15/15 DKTC w/ball 15x Supine 90/90 isometric hold 30s x2 Prone prop 2 min duration             [facility] Adult PT Treatment:                                                DATE: 07/07/2022 Therapeutic Exercise: Nustep level 5 x 7 mins  Standing hip abduction RTB at ankles 2x10 BIL Bridges 2x10 (hamstring cramp on Rt at end) Supine clamshell GTB 2x10 Supine marching GTB 2x10 BIL DKTC 2x30" SKTC 2x30" BIL Supine 90/90 isometric hold 3x20" Supine figure 4 piriformis stretch 2x30"  BIL Modified thomas stretch (too painful, cramps)                                                                                                          DATE: 07/04/22 Eval and HEP      PATIENT EDUCATION:  Education details: Discussed eval findings, rehab rationale and POC and patient is in agreement  Person educated: Patient and Child(ren) Education method: Explanation Education comprehension: verbalized understanding and needs further education   HOME EXERCISE PROGRAM: Access Code: [email or web address] URL: [email or web address] Date: 07/20/2022 Prepared by: Sharlynn Oliphant  Exercises - Curl Up with Arms Crossed  - 2 x daily - 5 x weekly - 1 sets - 15 reps - Supine 90/90 Abdominal Bracing  - 2 x daily - 5 x weekly - 2 sets - 2 reps - 30s hold - Sit to Stand with Arms Crossed  - 2 x daily - 5 x weekly - 1 sets - 10 reps - Supine 90/90 Alternating Toe Touch  - 2 x daily - 5 x weekly - 1 sets - 2 reps - 30s hold - Supine Posterior Pelvic Tilt  - 2 x daily - 5 x weekly - 2 sets - 10 reps - 30s hold   ASSESSMENT:   CLINICAL IMPRESSION: Relates some additional symptoms unrelated to PT diagnosis and was instructed to monitor those. Spoke with daughter prior to session and requested to DC following next session.  Focus of today's session was continued abdominal and lumbopelvic stabilization activities.  Increased weight and resistance/difficulty as noted.  Unable to effectively add more complex tasks due to language barrier and  difficulty performing activities using proper form.   OBJECTIVE IMPAIRMENTS: decreased activity tolerance, difficulty walking, decreased ROM, and pain.    ACTIVITY LIMITATIONS: carrying, lifting, bending, sitting, and squatting   PERSONAL FACTORS: Age, Past/current experiences, and Time since onset of injury/illness/exacerbation are also affecting patient's functional outcome.    REHAB POTENTIAL: Fair based on extent of degenerative changes   CLINICAL  DECISION MAKING: Stable/uncomplicated   EVALUATION COMPLEXITY: Low     GOALS: Goals reviewed with patient? Yes   SHORT TERM GOALS: Target date: 07/18/2022   Patient to demonstrate independence in HEP  Baseline:[id number] Goal status: Met     LONG TERM GOALS: Target date: 08/01/2022   Decrease worst pain to 4/10 Baseline: 7/10 Goal status: INITIAL   2.  Increase core strength to 3+/5 Baseline: 3/5 Goal status: INITIAL   3.  2/10 pain with FABER on L Baseline: 4/10 pain  Goal status: INITIAL       PLAN:   PT FREQUENCY: 2x/week   PT DURATION: 4 weeks   PLANNED INTERVENTIONS: Therapeutic exercises, Therapeutic activity, Neuromuscular re-education, Balance training, Gait training, Patient/Family education, Self Care, Joint mobilization, Stair training, DME instructions, Manual therapy, and Re-evaluation.   PLAN FOR NEXT SESSION: DC to HEP at patient and daughter request   Lanice Shirts, PT 07/20/2022, 11:31 AM

## 2022-07-27 ENCOUNTER — Ambulatory Visit: Payer: Commercial Managed Care - HMO

## 2022-07-27 DIAGNOSIS — M4316 Spondylolisthesis, lumbar region: Secondary | ICD-10-CM

## 2022-07-27 DIAGNOSIS — M6281 Muscle weakness (generalized): Secondary | ICD-10-CM

## 2022-07-27 DIAGNOSIS — M5459 Other low back pain: Secondary | ICD-10-CM

## 2022-07-27 NOTE — Therapy (Signed)
OUTPATIENT PHYSICAL THERAPY TREATMENT NOTE   Patient Name: Cindy Rivera MRN: [id number] DOB:[birth year], 71 y.o., female Today's Date: 07/27/2022  PCP: Ladell Pier, MD  REFERRING PROVIDER: Mariam Dollar, PA    END OF SESSION:   PT End of Session - 07/27/22 1054     Visit Number 6    Number of Visits 8    Date for PT Re-Evaluation 08/29/22    Authorization Type [employer]    PT Start Time 1050    PT Stop Time 1130    PT Time Calculation (min) 40 min    Activity Tolerance Patient tolerated treatment well    Behavior During Therapy [facility] for tasks assessed/performed             Past Medical History:  Diagnosis Date   Back pain    Fatty liver disease, nonalcoholic    Hypothyroidism    Past Surgical History:  Procedure Laterality Date   CATARACT EXTRACTION     Patient Active Problem List   Diagnosis Date Noted   Pure hypercholesterolemia 02/17/2022   Osteoporosis 01/26/2022   Hepatic steatosis 01/13/2022   Aortic atherosclerosis ([place]) 01/13/2022   Overweight (BMI 25.0-29.9) 01/13/2022   History of uterine fibroid 01/13/2022   Hypothyroidism (acquired) 01/13/2022   Bilateral lumbar radiculopathy 12/04/2021   Chronic cough 12/04/2021   Chronic sore throat 12/04/2021   Gastroesophageal reflux disease without esophagitis 12/04/2021   CAD in native artery 12/04/2021   Abnormal TSH 12/04/2021    REFERRING DIAG: M43.16 (ICD-10-CM) - Spondylolisthesis, lumbar region   THERAPY DIAG:  Spondylolisthesis of lumbar region  Muscle weakness (generalized)  Other low back pain  Rationale for Evaluation and Treatment Rehabilitation  PERTINENT HISTORY: None available  PRECAUTIONS: Back  SUBJECTIVE:         Utilized AMN interpreter throughout session.                                                                                                                                                                             SUBJECTIVE STATEMENT:  Pain minimal and  manageable.  Agreeable to DC   PAIN:  Are you having pain? Yes: NPRS scale: 4/10 Pain location: low back and RLE Pain description: ache and  Aggravating factors: straining with bowel movement, walking,  Relieving factors: undetermined, advil   OBJECTIVE: (objective measures completed at initial evaluation unless otherwise dated)   DIAGNOSTIC FINDINGS:  Severe degenerative changes of the lower lumbar spine with anterolisthesis  of L4 and L5 measuring approximately 8 mm and severe bilateral facet  arthropathy.   Note that there is transitional anatomy, with vertebral level numbering as  above. Recommend careful anatomic correlation prior to any spinal  intervention.  PATIENT SURVEYS:  UTA due to language barrier   SCREENING FOR RED FLAGS: negative   COGNITION: Overall cognitive status: Within functional limits for tasks assessed                          SENSATION: Not tested   MUSCLE LENGTH: Hamstrings: Right 90 deg; Left 90 deg   POSTURE: No Significant postural limitations   PALPATION: Not tested   LUMBAR ROM:    AROM eval  Flexion 90%  Extension 75%  Right lateral flexion    Left lateral flexion    Right rotation    Left rotation     (Blank rows = not tested)   LOWER EXTREMITY ROM:   WNL B    Active  Right eval Left eval  Hip flexion      Hip extension      Hip abduction      Hip adduction      Hip internal rotation      Hip external rotation      Knee flexion      Knee extension      Ankle dorsiflexion      Ankle plantarflexion      Ankle inversion      Ankle eversion       (Blank rows = not tested)   LOWER EXTREMITY MMT:     MMT Right eval Left eval  Hip flexion      Hip extension      Hip abduction      Hip adduction      Hip internal rotation      Hip external rotation      Knee flexion      Knee extension      Ankle dorsiflexion      Ankle plantarflexion      Ankle inversion      Ankle eversion      core 3 3    (Blank rows = not tested)   LUMBAR SPECIAL TESTS:  Prone instability test: Positive, Straight leg raise test: Negative, Slump test: Negative, and FABER test: Positive L hip capsule tightness which elicits low back pain   FUNCTIONAL TESTS:  5 times sit to stand: 14s arms crossed   GAIT: Distance walked: 20f x2 Assistive device utilized: None Level of assistance: Complete Independence Comments: unremarkable    TODAY'S TREATMENT:     [facility] Adult PT Treatment:                                                DATE: 07/27/22 Therapeutic Exercise: Nustep level 5 x 8 mins  Curl ups 15x Bridge 15x Single leg bridge 15/15 (unable to perform due to pain) Bridge with ball 15x Sidelie abduction 15x B Sidelie clams 15/15 Supine bicycling w/PPT 30s x2 DKTC w/ball 15x Supine 90/90 isometric hold 30s x2      [facility] Adult PT Treatment:                                                DATE: 07/25/22 Therapeutic Exercise: Nustep level 5 x 8 mins  Curl ups 15x B hamstring stretch in sitting 30s x2 PPT 3s 15x Bridge 15x  Bridge with ball 15x Supine clamshell GTB 15x Sidelie clams GTB 15/15 Supine bicycling w/PPT 30s x2 DKTC w/ball 15x Supine 90/90 isometric hold 30s x2 Prone prop 2 min duration  [facility] Adult PT Treatment:                                                DATE: 07/20/22 Therapeutic Exercise: Nustep level 4 x 8 mins  Curl ups 15x B hamstring stretch in sitting 30s x2 PPT 3s 15x Supine clamshell GTB 15x2 Supine marching w/PPT 30s x2 DKTC w/ball 15x Supine 90/90 isometric hold 30s x2 Prone prop 2 min duration  [facility] Adult PT Treatment:                                                DATE: 07/18/22 Therapeutic Exercise: Nustep level 4 x 8 mins  B hamstring stretch in sitting 30s x2 PPT 3s 10x Supine clamshell GTB 15x2 Supine marching w/PPT 15/15 DKTC w/ball 15x Supine 90/90 isometric hold 30s x2 Prone prop 2 min duration             [facility] Adult PT Treatment:                                                 DATE: 07/07/2022 Therapeutic Exercise: Nustep level 5 x 7 mins  Standing hip abduction RTB at ankles 2x10 BIL Bridges 2x10 (hamstring cramp on Rt at end) Supine clamshell GTB 2x10 Supine marching GTB 2x10 BIL DKTC 2x30" SKTC 2x30" BIL Supine 90/90 isometric hold 3x20" Supine figure 4 piriformis stretch 2x30" BIL Modified thomas stretch (too painful, cramps)                                                                                                          DATE: 07/04/22 Eval and HEP      PATIENT EDUCATION:  Education details: Discussed eval findings, rehab rationale and POC and patient is in agreement  Person educated: Patient and Child(ren) Education method: Explanation Education comprehension: verbalized understanding and needs further education   HOME EXERCISE PROGRAM: Access Code: [email or web address] URL: [email or web address] Date: 07/20/2022 Prepared by: Sharlynn Oliphant  Exercises - Curl Up with Arms Crossed  - 2 x daily - 5 x weekly - 1 sets - 15 reps - Supine 90/90 Abdominal Bracing  - 2 x daily - 5 x weekly - 2 sets - 2 reps - 30s hold - Sit to Stand with Arms Crossed  - 2 x daily - 5 x weekly - 1 sets - 10 reps - Supine 90/90 Alternating Toe Touch  - 2 x daily - 5 x weekly - 1 sets -  2 reps - 30s hold - Supine Posterior Pelvic Tilt  - 2 x daily - 5 x weekly - 2 sets - 10 reps - 30s hold   ASSESSMENT:   CLINICAL IMPRESSION: HEP updated and reviewed, goals met patient ready for independent management   OBJECTIVE IMPAIRMENTS: decreased activity tolerance, difficulty walking, decreased ROM, and pain.    ACTIVITY LIMITATIONS: carrying, lifting, bending, sitting, and squatting   PERSONAL FACTORS: Age, Past/current experiences, and Time since onset of injury/illness/exacerbation are also affecting patient's functional outcome.    REHAB POTENTIAL: Fair based on extent of degenerative changes   CLINICAL DECISION MAKING:  Stable/uncomplicated   EVALUATION COMPLEXITY: Low     GOALS: Goals reviewed with patient? Yes   SHORT TERM GOALS: Target date: 07/18/2022   Patient to demonstrate independence in HEP  Baseline:[id number] Goal status: Met     LONG TERM GOALS: Target date: 08/01/2022   Decrease worst pain to 4/10 Baseline: 7/10; 07/27/22 4-5/10 Goal status: essentially met   2.  Increase core strength to 3+/5 Baseline: 3/5; 07/27/22 3+/5 Goal status: Met   3.  2/10 pain with FABER on L Baseline: 4/10 pain; 07/27/22 0/10 Goal status: Met       PLAN:   PT FREQUENCY: 2x/week   PT DURATION: 4 weeks   PLANNED INTERVENTIONS: Therapeutic exercises, Therapeutic activity, Neuromuscular re-education, Balance training, Gait training, Patient/Family education, Self Care, Joint mobilization, Stair training, DME instructions, Manual therapy, and Re-evaluation.   PLAN FOR NEXT SESSION: DC to HEP at patient and daughter request   Lanice Shirts, PT 07/27/2022, 11:34 AM

## 2022-07-29 ENCOUNTER — Other Ambulatory Visit: Payer: Commercial Managed Care - HMO

## 2022-08-02 ENCOUNTER — Ambulatory Visit: Payer: Commercial Managed Care - HMO

## 2022-08-03 ENCOUNTER — Encounter: Payer: Self-pay | Admitting: Pulmonary Disease

## 2022-08-03 ENCOUNTER — Ambulatory Visit
Admission: RE | Admit: 2022-08-03 | Discharge: 2022-08-03 | Disposition: A | Discharge status: Home / Self Care | Payer: Commercial Managed Care - HMO | Source: Ambulatory Visit | Attending: Pulmonary Disease | Admitting: Pulmonary Disease

## 2022-08-03 DIAGNOSIS — R053 Chronic cough: Secondary | ICD-10-CM

## 2022-08-03 NOTE — Telephone Encounter (Signed)
I did go ahead and placed a new order specifying prone position and that an addended CT will need to be done from the CT that was performed today. Also called [facility] in [place] and spoke with technician Toni Amend as Kennyth Arnold had already left. Toni Amend told me she would send the message to the person who reviews CTs to see if a new order needed to be placed to show that or if they are able to have prone imaging shown from the CT that pt just did. She stated that we should receive a call from them tomorrow about this.  Will leave encounter open.

## 2022-08-03 NOTE — Telephone Encounter (Signed)
I see that CT was completed. No changes need made

## 2022-08-03 NOTE — Telephone Encounter (Signed)
Dr. Val Eagle, please see mychart message sent by pt's daughter and advise.

## 2022-08-03 NOTE — Telephone Encounter (Signed)
Yes, order prone images please

## 2022-08-03 NOTE — Telephone Encounter (Signed)
Sorry, we may need to clarify with [place] [facility]

## 2022-08-04 ENCOUNTER — Telehealth: Payer: Self-pay | Admitting: Pulmonary Disease

## 2022-08-04 NOTE — Telephone Encounter (Signed)
Patient's daughter may call for results of CT scan  I did look at the CT scan myself, no official report yet  There is no evidence of scarring at the bases The atelectasis that was present on the CT scan that was done for her heart is no longer present  She does not need to repeat any follow-up CT, does not need prone images

## 2022-08-04 NOTE — Telephone Encounter (Signed)
Mychart message sent to daughter and patient about Ct scan. Nothing further needed

## 2022-08-17 ENCOUNTER — Ambulatory Visit: Payer: Commercial Managed Care - HMO | Admitting: Pulmonary Disease

## 2022-08-17 ENCOUNTER — Encounter: Payer: Self-pay | Admitting: Pulmonary Disease

## 2022-08-17 ENCOUNTER — Other Ambulatory Visit: Payer: Self-pay | Admitting: Pulmonary Disease

## 2022-08-17 ENCOUNTER — Other Ambulatory Visit: Payer: Commercial Managed Care - HMO

## 2022-08-17 VITALS — BP 104/68 | HR 60 | Temp 98.5°F | Ht 60.0 in | Wt 144.4 lb

## 2022-08-17 DIAGNOSIS — R053 Chronic cough: Secondary | ICD-10-CM

## 2022-08-17 MED ORDER — FLUTICASONE FUROATE-VILANTEROL 100-25 MCG/ACT IN AEPB: 1.0000 | INHALATION_SPRAY | Freq: Every day | RESPIRATORY_TRACT | 3 refills | Status: AC

## 2022-08-17 NOTE — Patient Instructions (Signed)
I will see you in about 3 months  Trial with Breo -This was sent to the pharmacy again  Graded exercise as tolerated  We will update you with the sleep study result as soon as reviewed

## 2022-08-17 NOTE — Progress Notes (Signed)
Cindy Rivera    [id number]    August 04, 1951  Primary Care Physician:Johnson, Binnie Rail, MD  Referring Physician: Marcine Matar, MD [address] [place] [address] [place],  [place] [address]  Chief complaint:   Chronic cough for years Shortness of breath about the same  Patient accompanied by daughter who was able to interpret for her  HPI:  Snoring and choking respirations at night -Waking up with a dry mouth, usually goes to bed about midnight, wakes up at 6 AM Sleep is nonrestorative Sometimes with insomnia  Sleep study was ordered the last time she was in -Yet to be done  Shortness of breath remains about the same -We have discussed starting Virgel Bouquet -Breo not started yet  There was concern for interstitial changes on a cardiac CT -Did have a repeat CT scan of the chest showing no scarring in the lungs  Breathing remains about the same  Exposure to secondhand smoke from her spouse  Occasional cough, shortness of breath, occasional wheezes  She takes her time to do things, she does get more short of breath walking uphill or when she is walking fast or when the weather is colder  She never smoked cigarettes She worked in [employer] and 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   Multiple Vitamins-Minerals (MULTI FOR HER) TABS Take 1 tablet by mouth as needed.   omeprazole (PRILOSEC) 40 MG capsule Take 1 capsule (40 mg total) by mouth 2 (two) times daily. (Patient taking differently: Take 40 mg by mouth 2 (two) times daily. Use twice a day for 8 weeks then reduce to 40 mg daily.)    Oyster Shell Calcium 500 MG TABS Take 1 tablet by mouth daily.   zoledronic acid (RECLAST) 5 MG/100ML SOLN injection Inject 5 mg into the vein once.   fluticasone furoate-vilanterol (BREO ELLIPTA) 100-25 MCG/ACT AEPB Inhale 1 puff into the lungs daily. (Patient not taking: Reported on 08/17/2022)   loratadine (CLARITIN) 10 MG tablet Take 1 tablet (10 mg total) by mouth daily. (Patient not taking: Reported on 07/12/2022)   No facility-administered encounter medications on file as of 08/17/2022.    Allergies as of 08/17/2022 - Review Complete 08/17/2022  Allergen Reaction Noted   Latex Swelling 05/18/2022   Berberine  10/09/2021   Crestor [rosuvastatin] Swelling 02/17/2022   Lipitor [atorvastatin] Swelling 02/17/2022   Repatha [evolocumab] Swelling 06/14/2022    Past Medical History:  Diagnosis Date   Back pain    Fatty liver disease, nonalcoholic    Hypothyroidism     Past Surgical History:  Procedure Laterality Date   CATARACT EXTRACTION      Family History  Problem Relation Age of Onset   Colon cancer Neg Hx    Stomach cancer Neg Hx    Esophageal cancer Neg Hx    Colon polyps Neg Hx     Social History   Socioeconomic History   Marital status: Single    Spouse name: Not on file   Number of children: 1   Years of education: Not on file  Highest education level: Not on file  Occupational History   Occupation: Retired  Tobacco Use   Smoking status: Never   Smokeless tobacco: Never  Vaping Use   Vaping Use: Never used  Substance and Sexual Activity   Alcohol use: Never   Drug use: Never   Sexual activity: Not on file  Other Topics Concern   Not on file  Social History Narrative   Not on file   Social Determinants of Health   Financial Resource Strain: Not on file  Food Insecurity: Not on file  Transportation Needs: Not on file  Physical Activity: Not on file  Stress: Not on file  Social Connections: Not on file  Intimate Partner Violence: Not on file     Review of Systems  Respiratory:  Positive for cough and shortness of breath.   Psychiatric/Behavioral:  Positive for sleep disturbance.     Vitals:   08/17/22 1127  BP: 104/68  Pulse: 60  Temp: 98.5 F (36.9 C)  SpO2: 96%     Physical Exam Constitutional:      Appearance: Normal appearance.  HENT:     Head: Normocephalic.     Mouth/Throat:     Mouth: Mucous membranes are moist.  Eyes:     General:        Right eye: No discharge.        Left eye: No discharge.  Cardiovascular:     Rate and Rhythm: Normal rate and regular rhythm.     Heart sounds: No murmur heard. Pulmonary:     Effort: No respiratory distress.     Breath sounds: No stridor. No wheezing or rhonchi.  Musculoskeletal:     Cervical back: No rigidity or tenderness.  Neurological:     Mental Status: She is alert.  Psychiatric:        Mood and Affect: Mood normal.       07/12/2022    1:00 PM  Results of the Epworth flowsheet  Sitting and reading 1  Watching TV 1  Sitting, inactive in a public place (e.g. a theatre or a meeting) 0  As a passenger in a car for an hour without a break 2  Lying down to rest in the afternoon when circumstances permit 0  Sitting and talking to someone 0  Sitting quietly after a lunch without alcohol 0  In a car, while stopped for a few minutes in traffic 0  Total score 4     Data Reviewed: Cardiac CT reviewed-bibasal atelectasis, some groundglass changes -Repeat CT was reviewed with the patient and her daughter today -No evidence of interstitial changes at the bases of the lungs -Study is not consistent with ILD  Echocardiogram 12/31/2021 with normal ejection fraction with normal right-sided pressures  Sleep study not done yet  PFT with obstructive-significant bronchodilator response  Assessment:  Concern for sleep disordered breathing -Restorative sleep -Insomnia -Follow-up with a sleep study  Shortness of breath with PFT showing significant  bronchodilator response -We have discussed about trial with Breo -Prescription for Virgel Bouquet was resent to pharmacy today  Concern for abnormal CT scan of the chest showing groundglass changes at the bases -Follow-up CT shows no interstitial changes of concern   Plan/Recommendations: Obtain sleep study  Trial with Breo 100 -Knows to rinse her mouth following use -May decide to stop inhaler if no benefit or after 2 to 3 months if she does not want to continue inhalers  Follow-up in 3 months  Encouraged to call with  significant concerns  Graded exercise as tolerated  Virl Diamond MD [facility] 08/17/2022, 11:59 AM  CC: Marcine Matar, MD

## 2022-08-26 ENCOUNTER — Ambulatory Visit: Payer: Commercial Managed Care - HMO | Admitting: Internal Medicine

## 2022-08-30 ENCOUNTER — Encounter: Payer: Self-pay | Admitting: Internal Medicine

## 2022-08-31 ENCOUNTER — Other Ambulatory Visit: Payer: Self-pay | Admitting: Internal Medicine

## 2022-08-31 DIAGNOSIS — K760 Fatty (change of) liver, not elsewhere classified: Secondary | ICD-10-CM

## 2022-09-01 ENCOUNTER — Ambulatory Visit: Payer: Commercial Managed Care - HMO | Admitting: Gastroenterology

## 2022-10-25 ENCOUNTER — Encounter: Payer: Self-pay | Admitting: Internal Medicine

## 2022-10-26 ENCOUNTER — Other Ambulatory Visit: Payer: Self-pay | Admitting: Internal Medicine

## 2022-10-26 DIAGNOSIS — J449 Chronic obstructive pulmonary disease, unspecified: Secondary | ICD-10-CM

## 2022-10-26 DIAGNOSIS — R0683 Snoring: Secondary | ICD-10-CM

## 2022-11-18 ENCOUNTER — Ambulatory Visit: Payer: Self-pay | Admitting: Internal Medicine

## 2022-11-21 ENCOUNTER — Ambulatory Visit: Payer: Commercial Managed Care - HMO | Admitting: Internal Medicine

## 2022-12-02 ENCOUNTER — Encounter: Payer: Self-pay | Admitting: Internal Medicine

## 2023-06-01 IMAGING — DX DG CHEST 2V
2 series · 2 of 2 positions shown · non-contrast
Comparison: None.

CLINICAL DATA: Cough

EXAM:
CHEST - 2 VIEW

[chest pa]
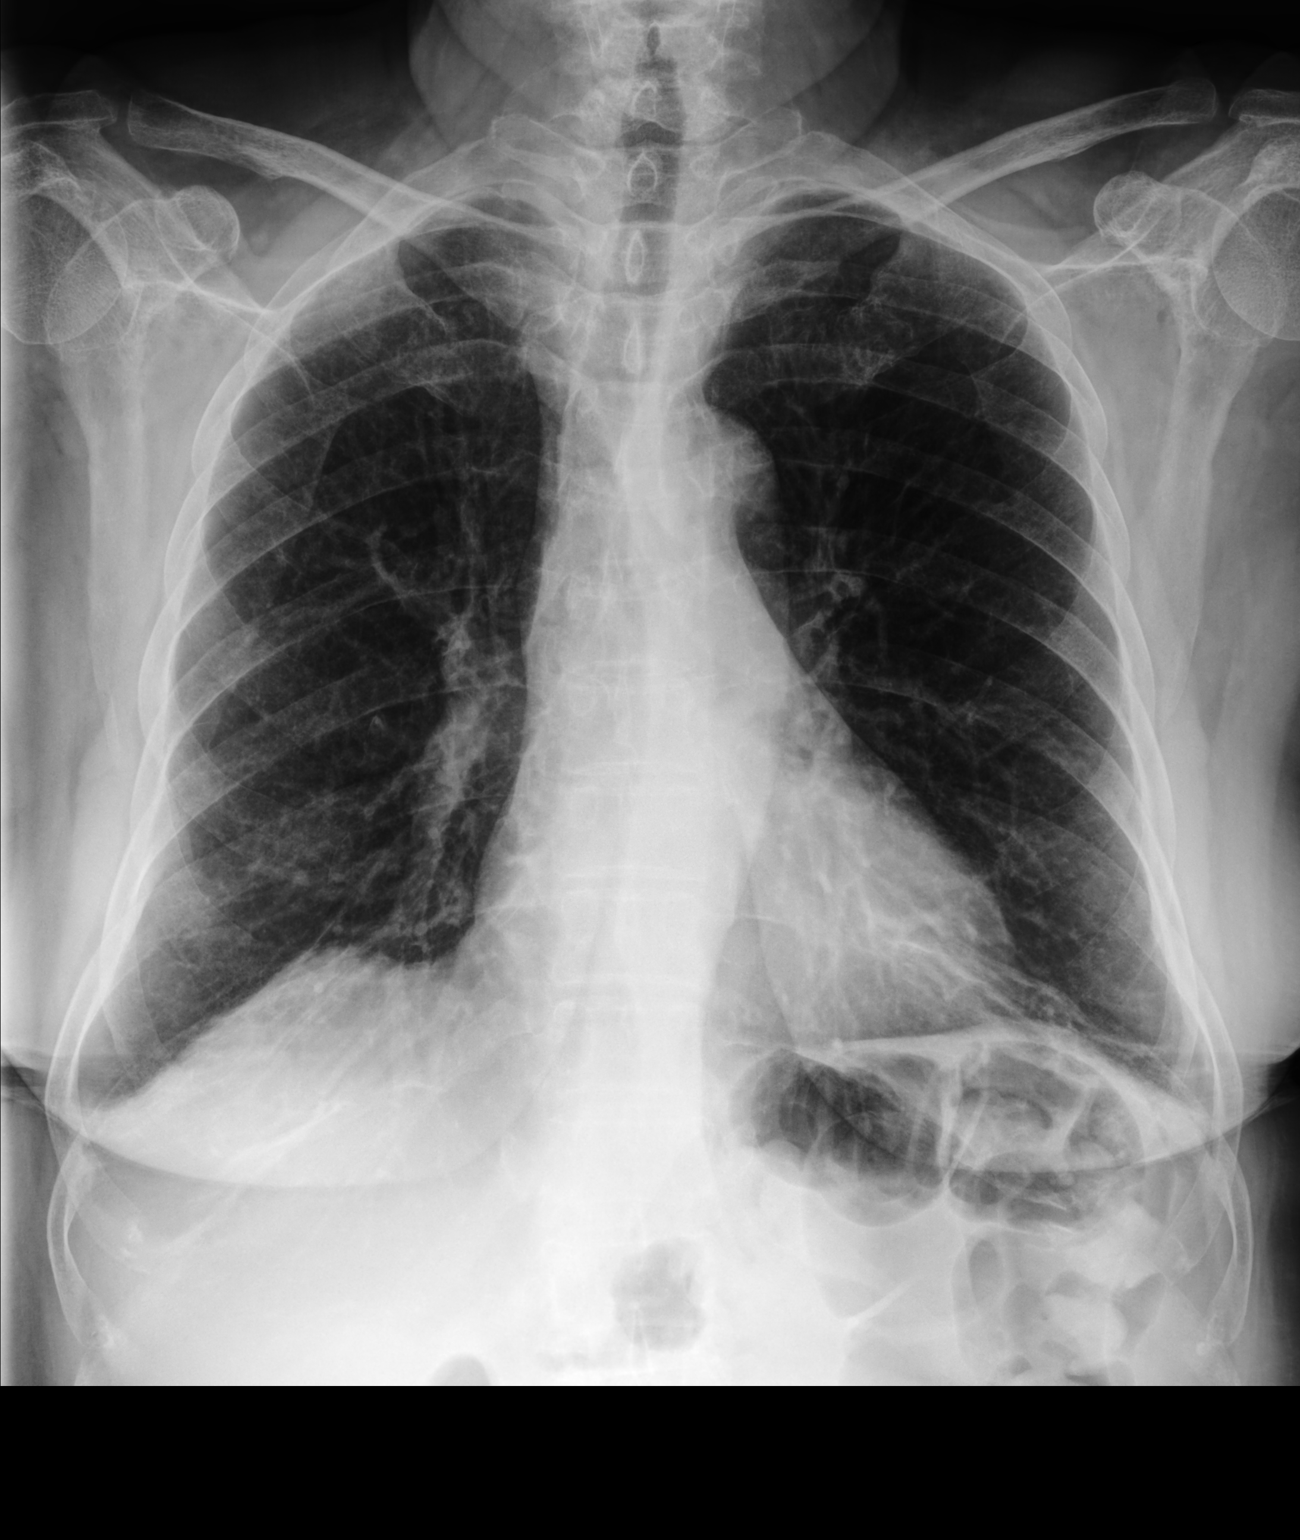

[chest lat]
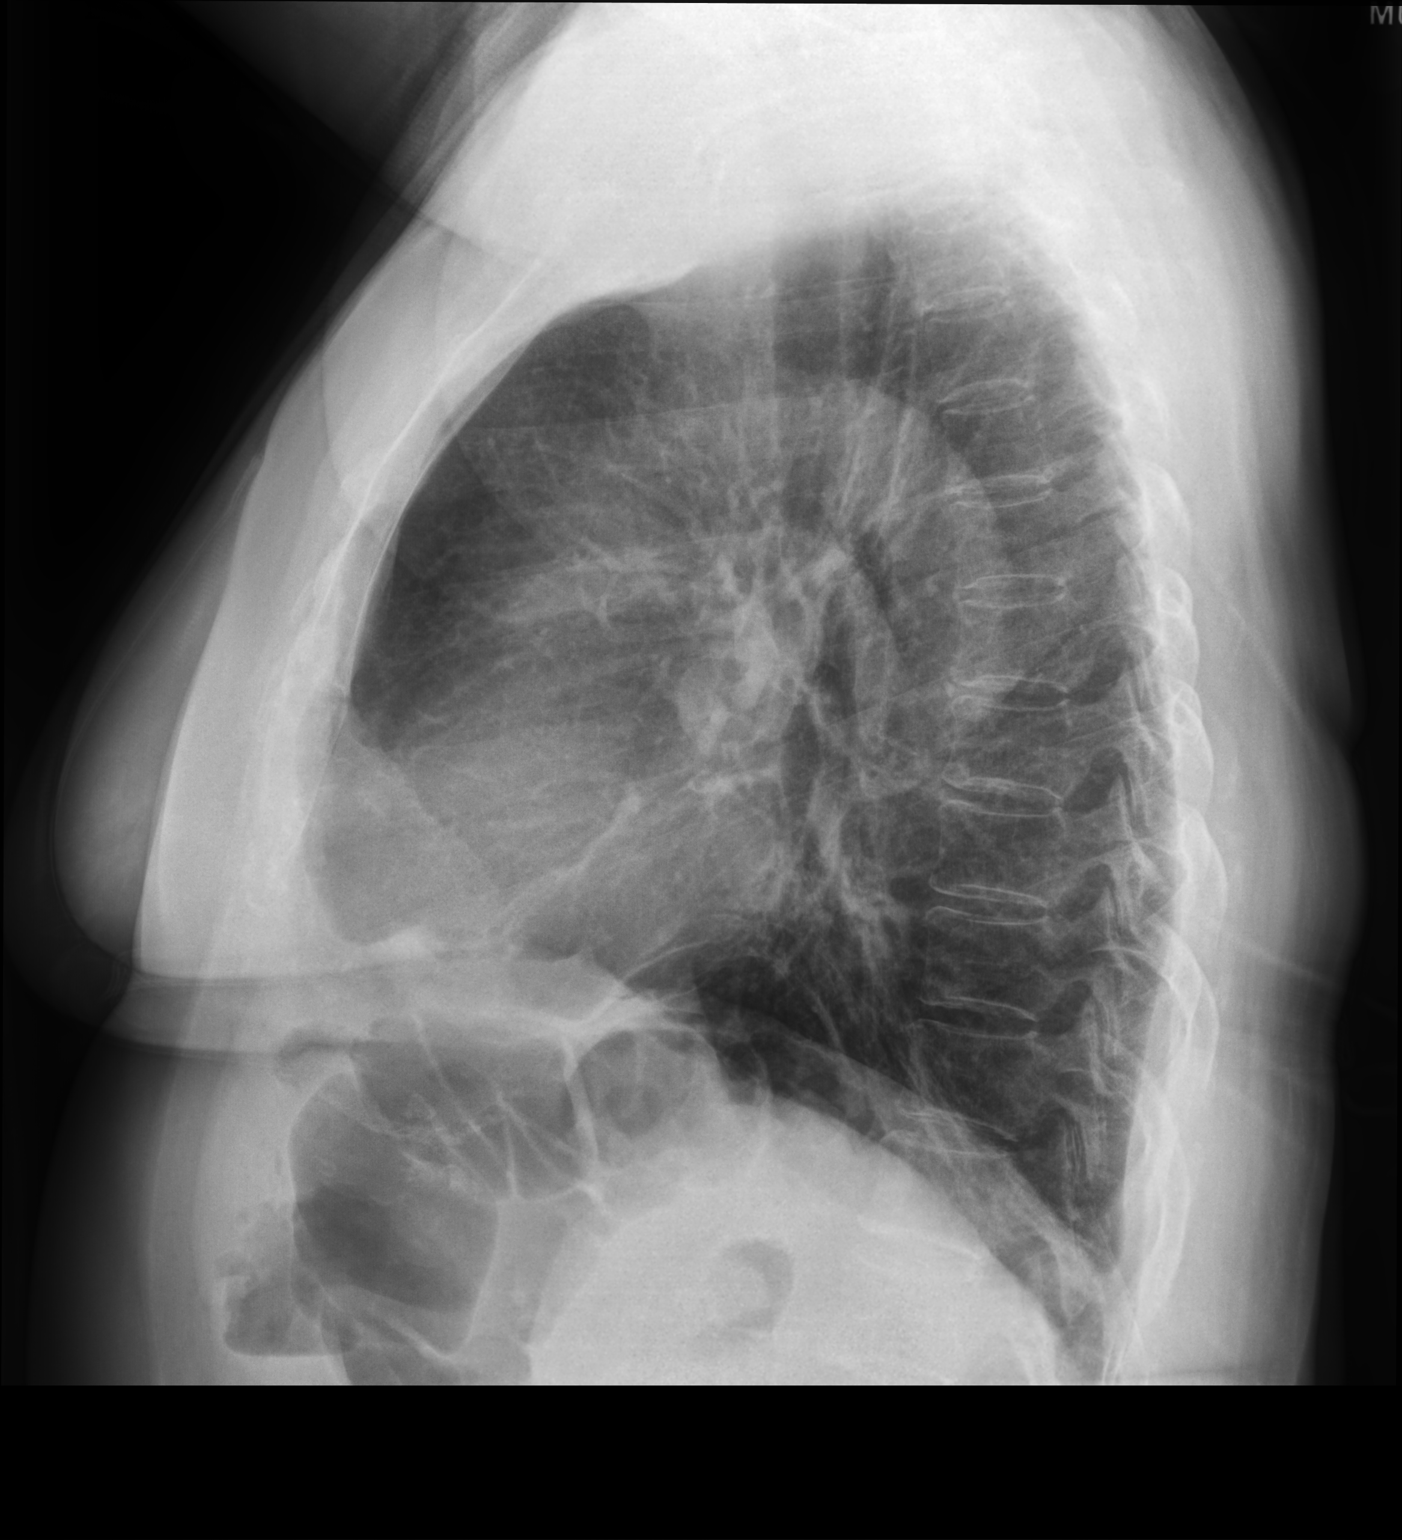

[2 of 2 positions shown; findings below may reference images not displayed]

FINDINGS: The heart size and mediastinal contours are within normal limits.
Both lungs are clear. The visualized skeletal structures are
unremarkable.
IMPRESSION: No acute abnormality of the lungs.

## 2023-06-01 IMAGING — DX DG LUMBAR SPINE COMPLETE 4+V
5 series · 5 of 5 positions shown · non-contrast
Comparison: None.

CLINICAL DATA: Back pain

EXAM:
LUMBAR SPINE - COMPLETE 4+ VIEW

[lumbar spine ap]
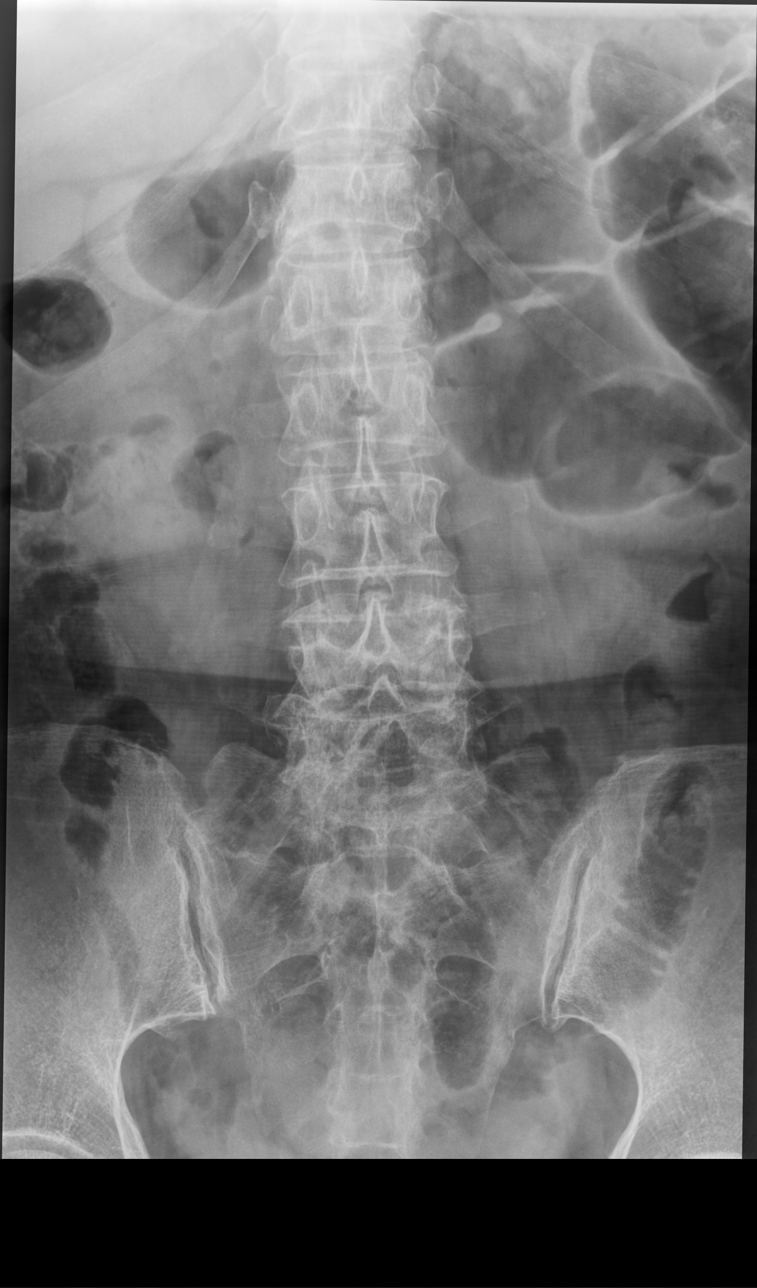

[lumbar spine oblique supine (1 of 2)]
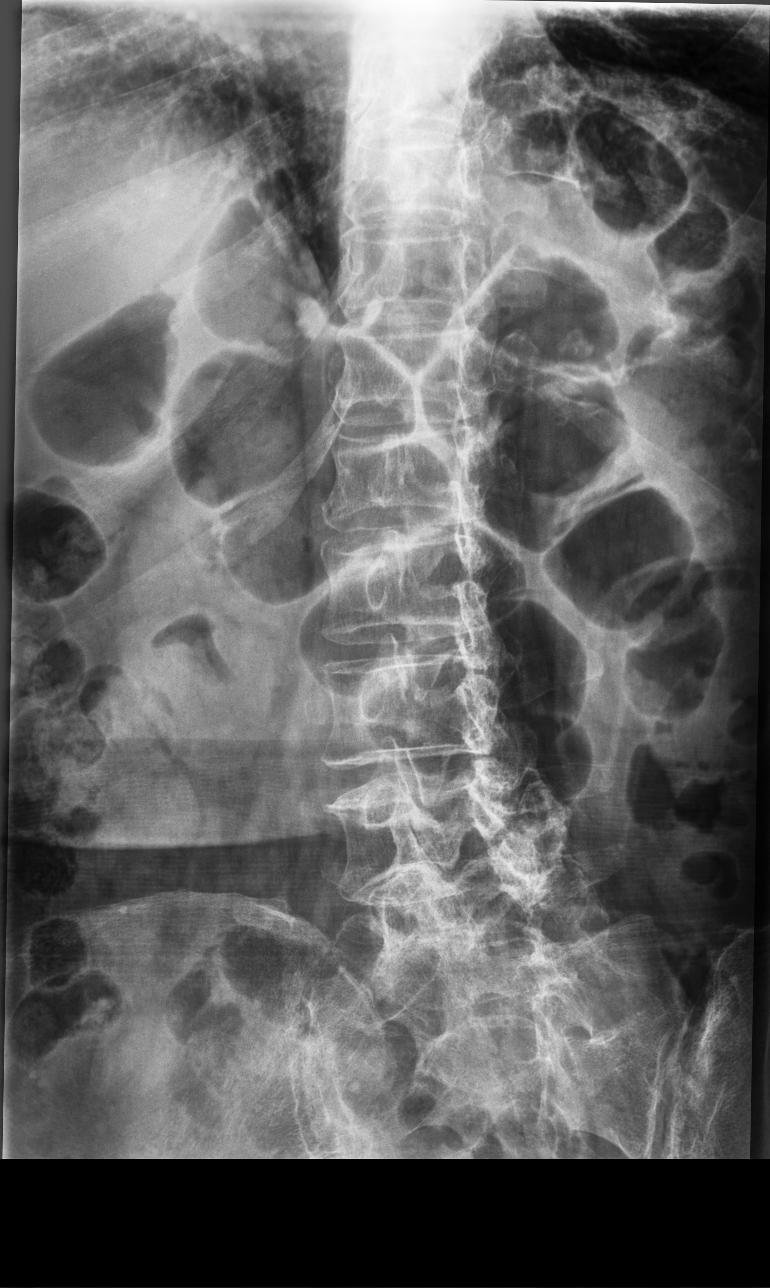

[lumbar spine oblique supine (2 of 2)]
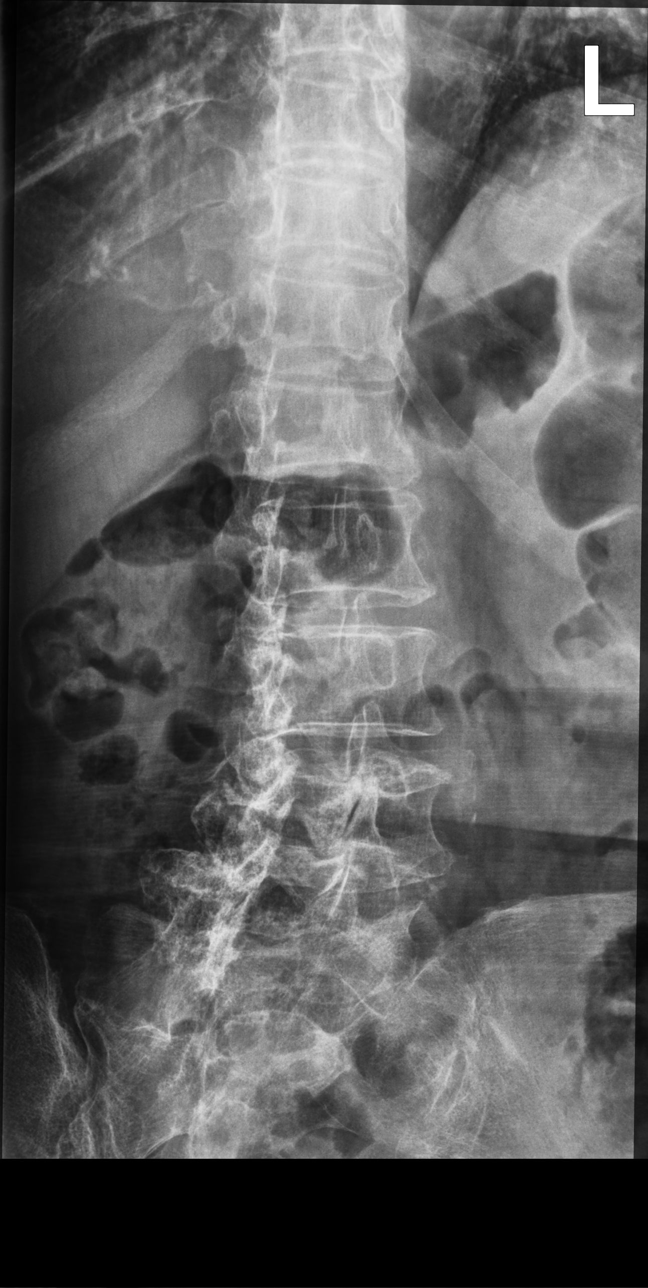

[lumbar spine lat]
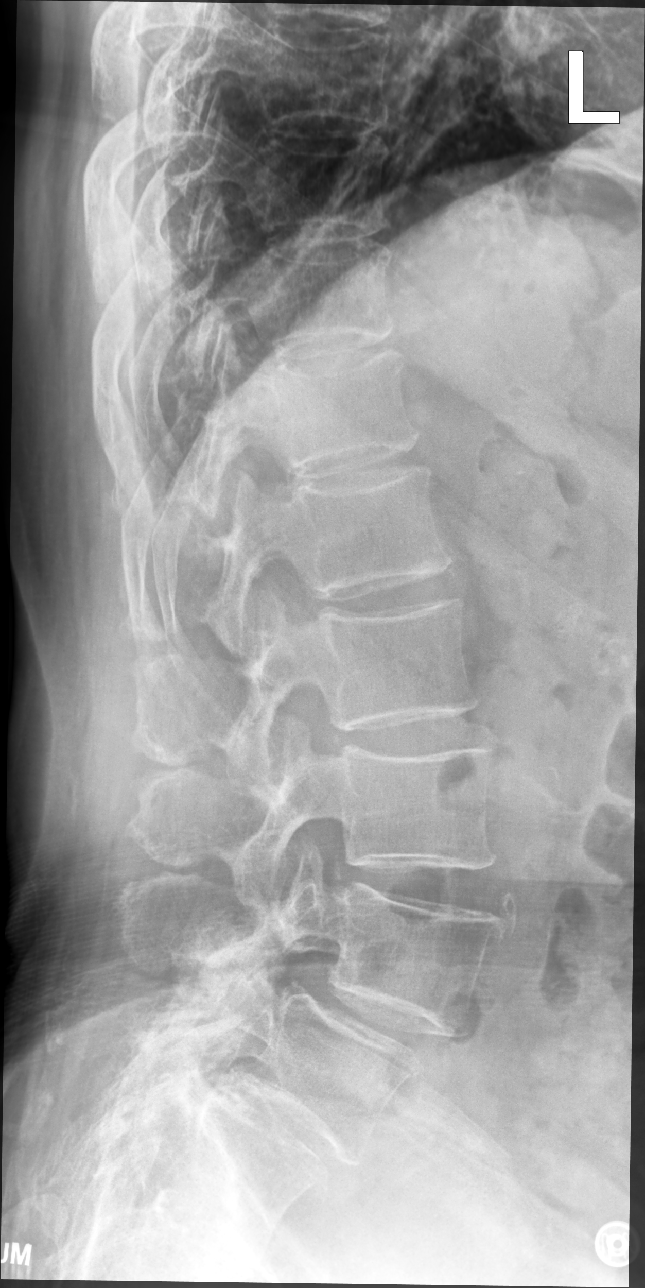

[lumbar spine l5-s1]
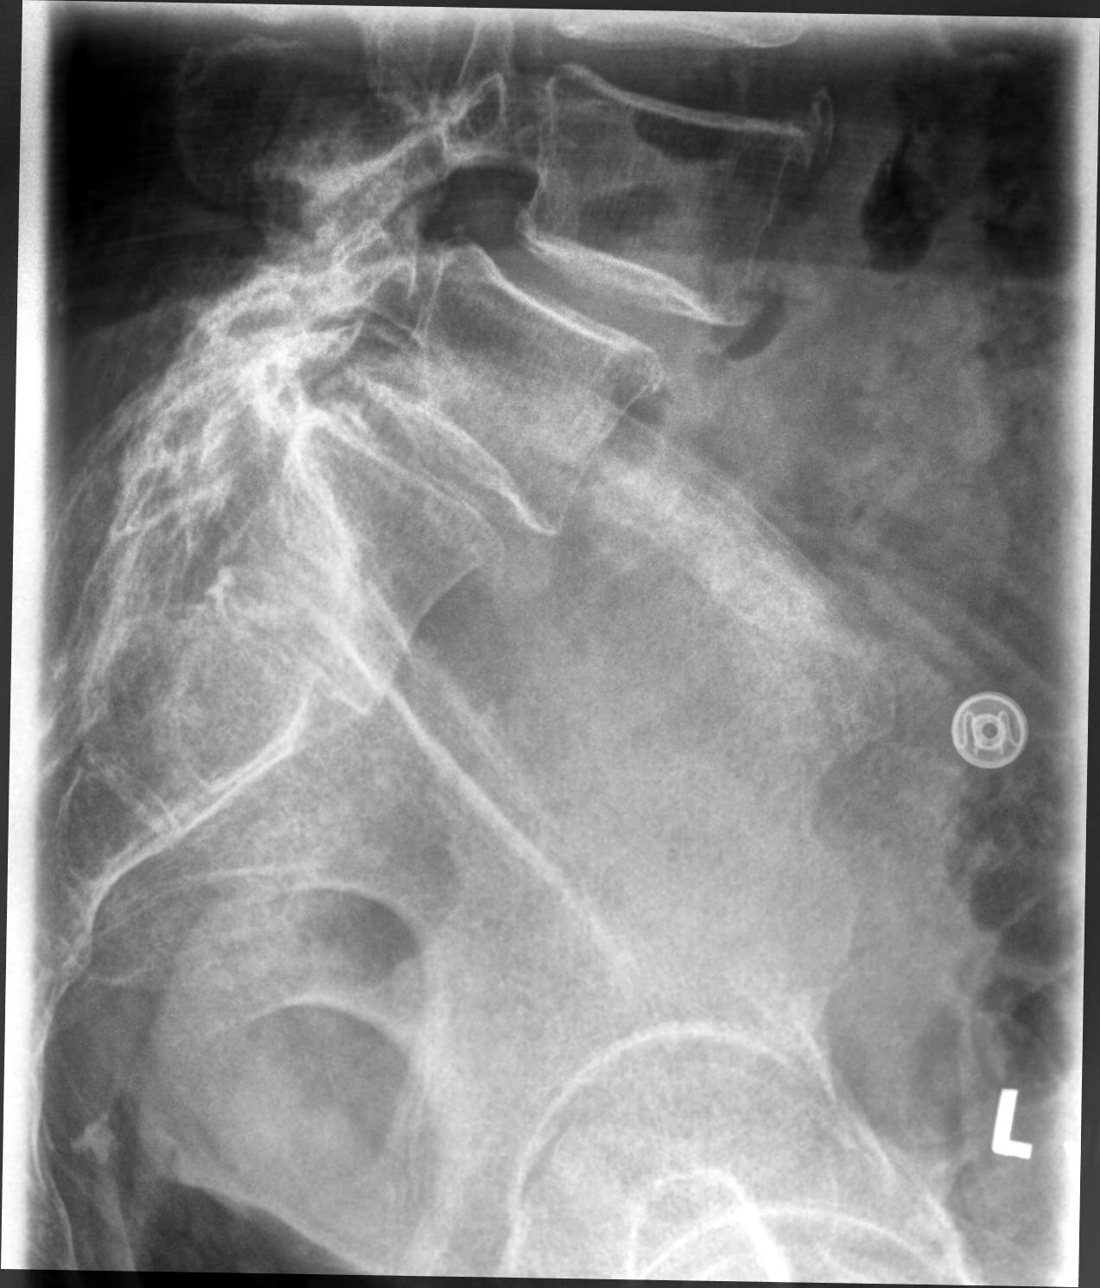

[5 of 5 positions shown; findings below may reference images not displayed]

FINDINGS: Last lumbar vertebra is transitional. This report assumes partial
lumbarization of S1 vertebra. No recent fracture is seen.
Degenerative changes are noted in the lower lumbar spine with marked
facet hypertrophy from L4-S1 levels. There is disc space narrowing
at L4-L5 and L5-S1 levels. There is approximately 9 mm
anterolisthesis at L4-L5 level. There is possible 5 mm
anterolisthesis at L5-S1 level.
IMPRESSION: No recent fracture is seen. Lumbar spondylosis, particularly severe
at L4-L5 and L5-S1 levels as described in the body of the report.
